# Patient Record
Sex: Female | Born: 1980 | Race: White | Hispanic: No | Marital: Married
Health system: Midwestern US, Community
[De-identification: ages and names within clinical notes are randomized; demographics above are authoritative.]

## PROBLEM LIST (undated history)

## (undated) DIAGNOSIS — E782 Mixed hyperlipidemia: Principal | ICD-10-CM

## (undated) DIAGNOSIS — D508 Other iron deficiency anemias: Secondary | ICD-10-CM

## (undated) DIAGNOSIS — K8020 Calculus of gallbladder without cholecystitis without obstruction: Secondary | ICD-10-CM

## (undated) DIAGNOSIS — K8010 Calculus of gallbladder with chronic cholecystitis without obstruction: Secondary | ICD-10-CM

## (undated) DIAGNOSIS — I10 Essential (primary) hypertension: Secondary | ICD-10-CM

---

## 2004-01-28 ENCOUNTER — Observation Stay: Payer: Self-pay

## 2004-01-31 ENCOUNTER — Ambulatory Visit: Payer: Self-pay

## 2004-03-03 ENCOUNTER — Observation Stay: Payer: Self-pay

## 2004-03-11 ENCOUNTER — Inpatient Hospital Stay: Payer: Self-pay

## 2011-04-07 ENCOUNTER — Ambulatory Visit: Payer: Self-pay

## 2011-05-06 ENCOUNTER — Observation Stay: Payer: Self-pay

## 2011-05-06 LAB — PIH PROFILE
Anion Gap: 13 (ref 7–16)
BUN: 6 mg/dL — ABNORMAL LOW (ref 7–18)
Calcium, Total: 8.5 mg/dL (ref 8.5–10.1)
Co2: 23 mmol/L (ref 21–32)
Creatinine: 0.53 mg/dL — ABNORMAL LOW (ref 0.60–1.30)
EGFR (African American): >60
Glucose: 95 mg/dL (ref 65–99)
HGB: 11.2 g/dL — ABNORMAL LOW (ref 12.0–16.0)
MCH: 30.8 pg (ref 26.0–34.0)
MCV: 89 fL (ref 80–100)
Potassium: 3.4 mmol/L — ABNORMAL LOW (ref 3.5–5.1)
RBC: 3.65 10*6/uL — ABNORMAL LOW (ref 3.80–5.20)
Sodium: 142 mmol/L (ref 136–145)
Uric Acid: 3.7 mg/dL (ref 2.6–6.0)

## 2011-05-06 LAB — PROTEIN / CREATININE RATIO, URINE
Protein, Random Urine: 41 mg/dL — ABNORMAL HIGH (ref 0–12)
Protein/Creat. Ratio: 154 mg/gCREAT (ref 0–200)

## 2011-05-08 ENCOUNTER — Inpatient Hospital Stay: Payer: Self-pay | Admitting: Obstetrics and Gynecology

## 2011-05-08 LAB — CBC WITH DIFFERENTIAL/PLATELET
Basophil %: 0.4 %
Eosinophil #: 0 10*3/uL (ref 0.0–0.7)
Eosinophil %: 0.4 %
HCT: 32.1 % — ABNORMAL LOW (ref 35.0–47.0)
HGB: 10.9 g/dL — ABNORMAL LOW (ref 12.0–16.0)
Lymphocyte #: 1.4 10*3/uL (ref 1.0–3.6)
Lymphocyte %: 11 %
MCH: 30.4 pg (ref 26.0–34.0)
MCHC: 33.9 g/dL (ref 32.0–36.0)
MCV: 90 fL (ref 80–100)
Neutrophil #: 10.4 10*3/uL — ABNORMAL HIGH (ref 1.4–6.5)
RBC: 3.59 10*6/uL — ABNORMAL LOW (ref 3.80–5.20)
RDW: 17.9 % — ABNORMAL HIGH (ref 11.5–14.5)

## 2012-07-10 IMAGING — CR DG CHEST 2V
1 series · 2 of 2 positions shown · non-contrast
Comparison: none

REASON FOR EXAM: preg pt w/ cough, bronchitis and chest pain
COMMENTS:

PROCEDURE:     DXR - DXR CHEST PA (OR AP) AND LATERAL  - April 07, 2011  [DATE]
RESULT:     Comparison: None

[Series 1: w chest pa · 0.14mm/px · 2 of 2 slices shown]
[im 1/2]
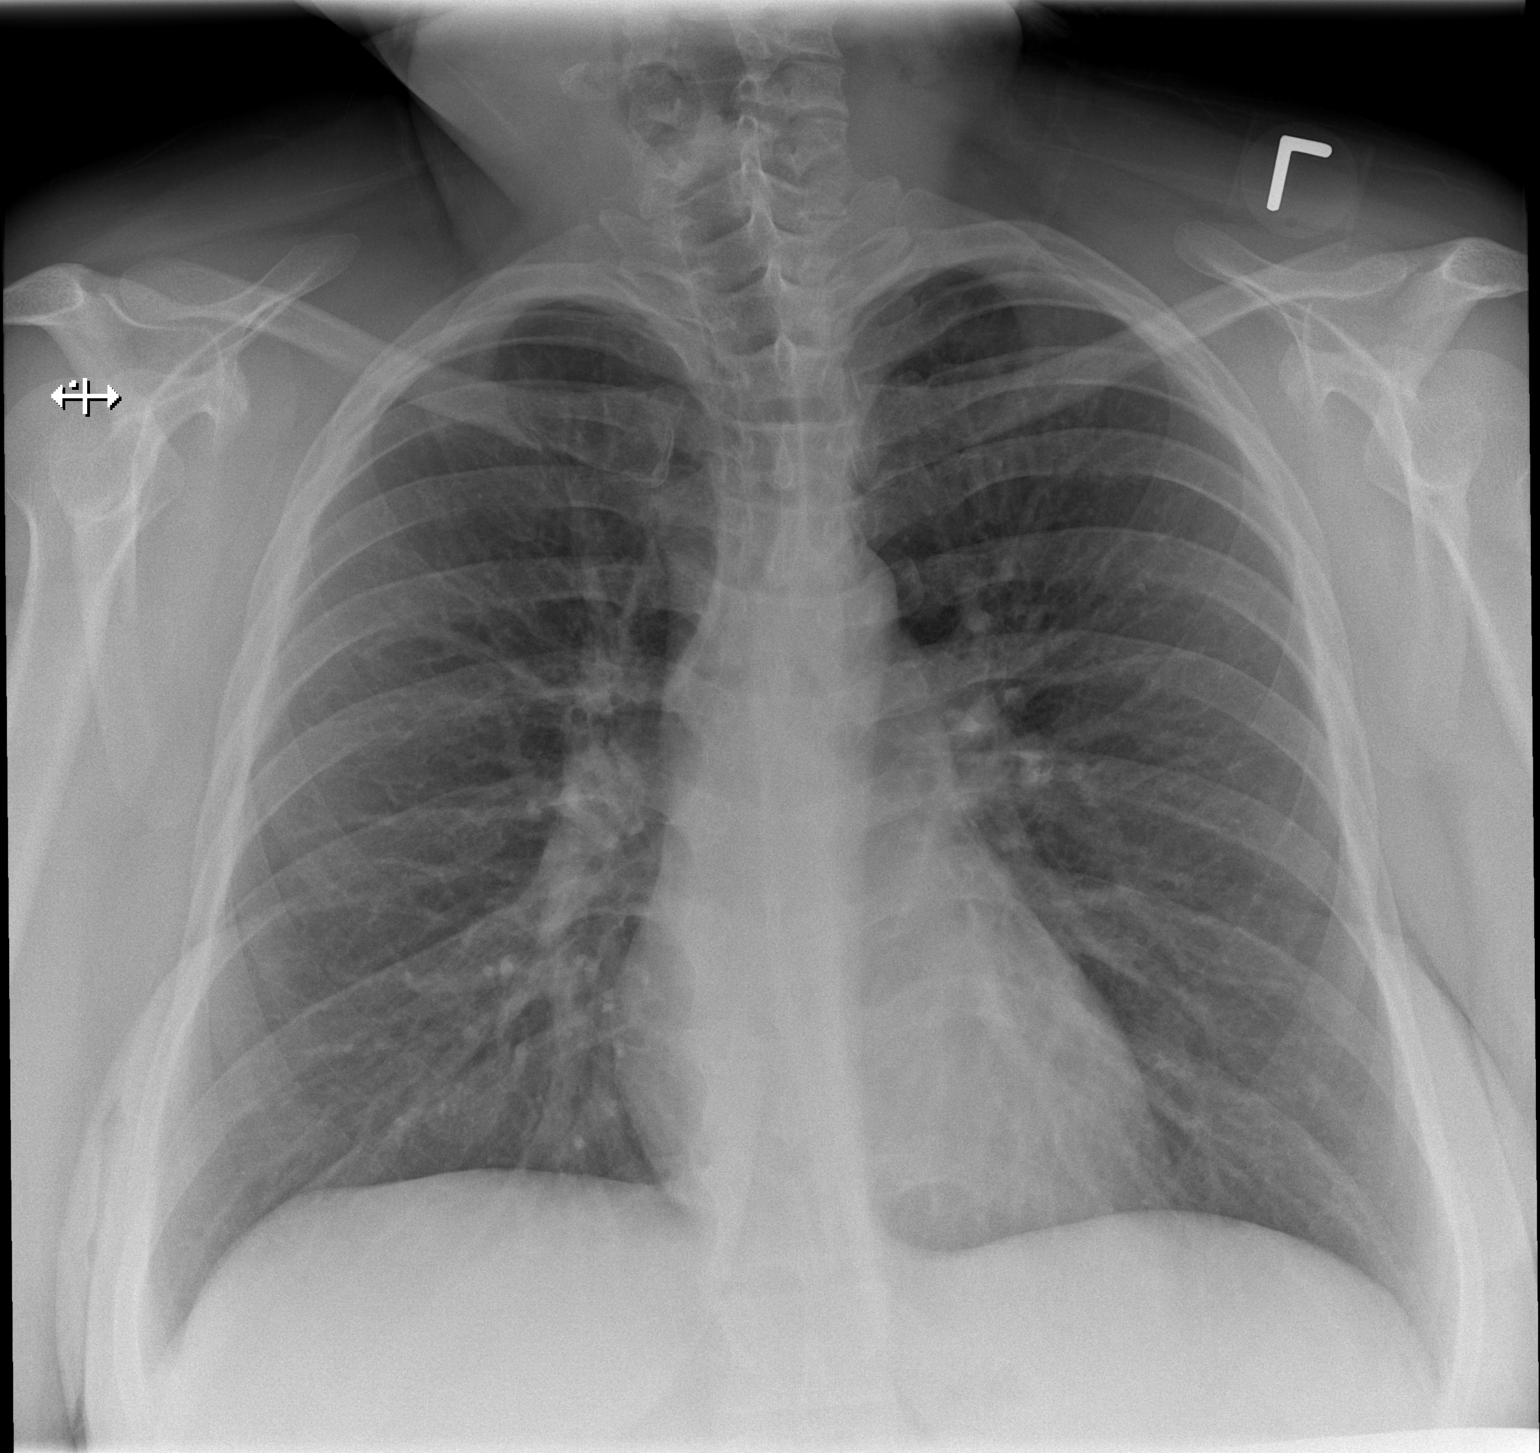
[im 2/2]
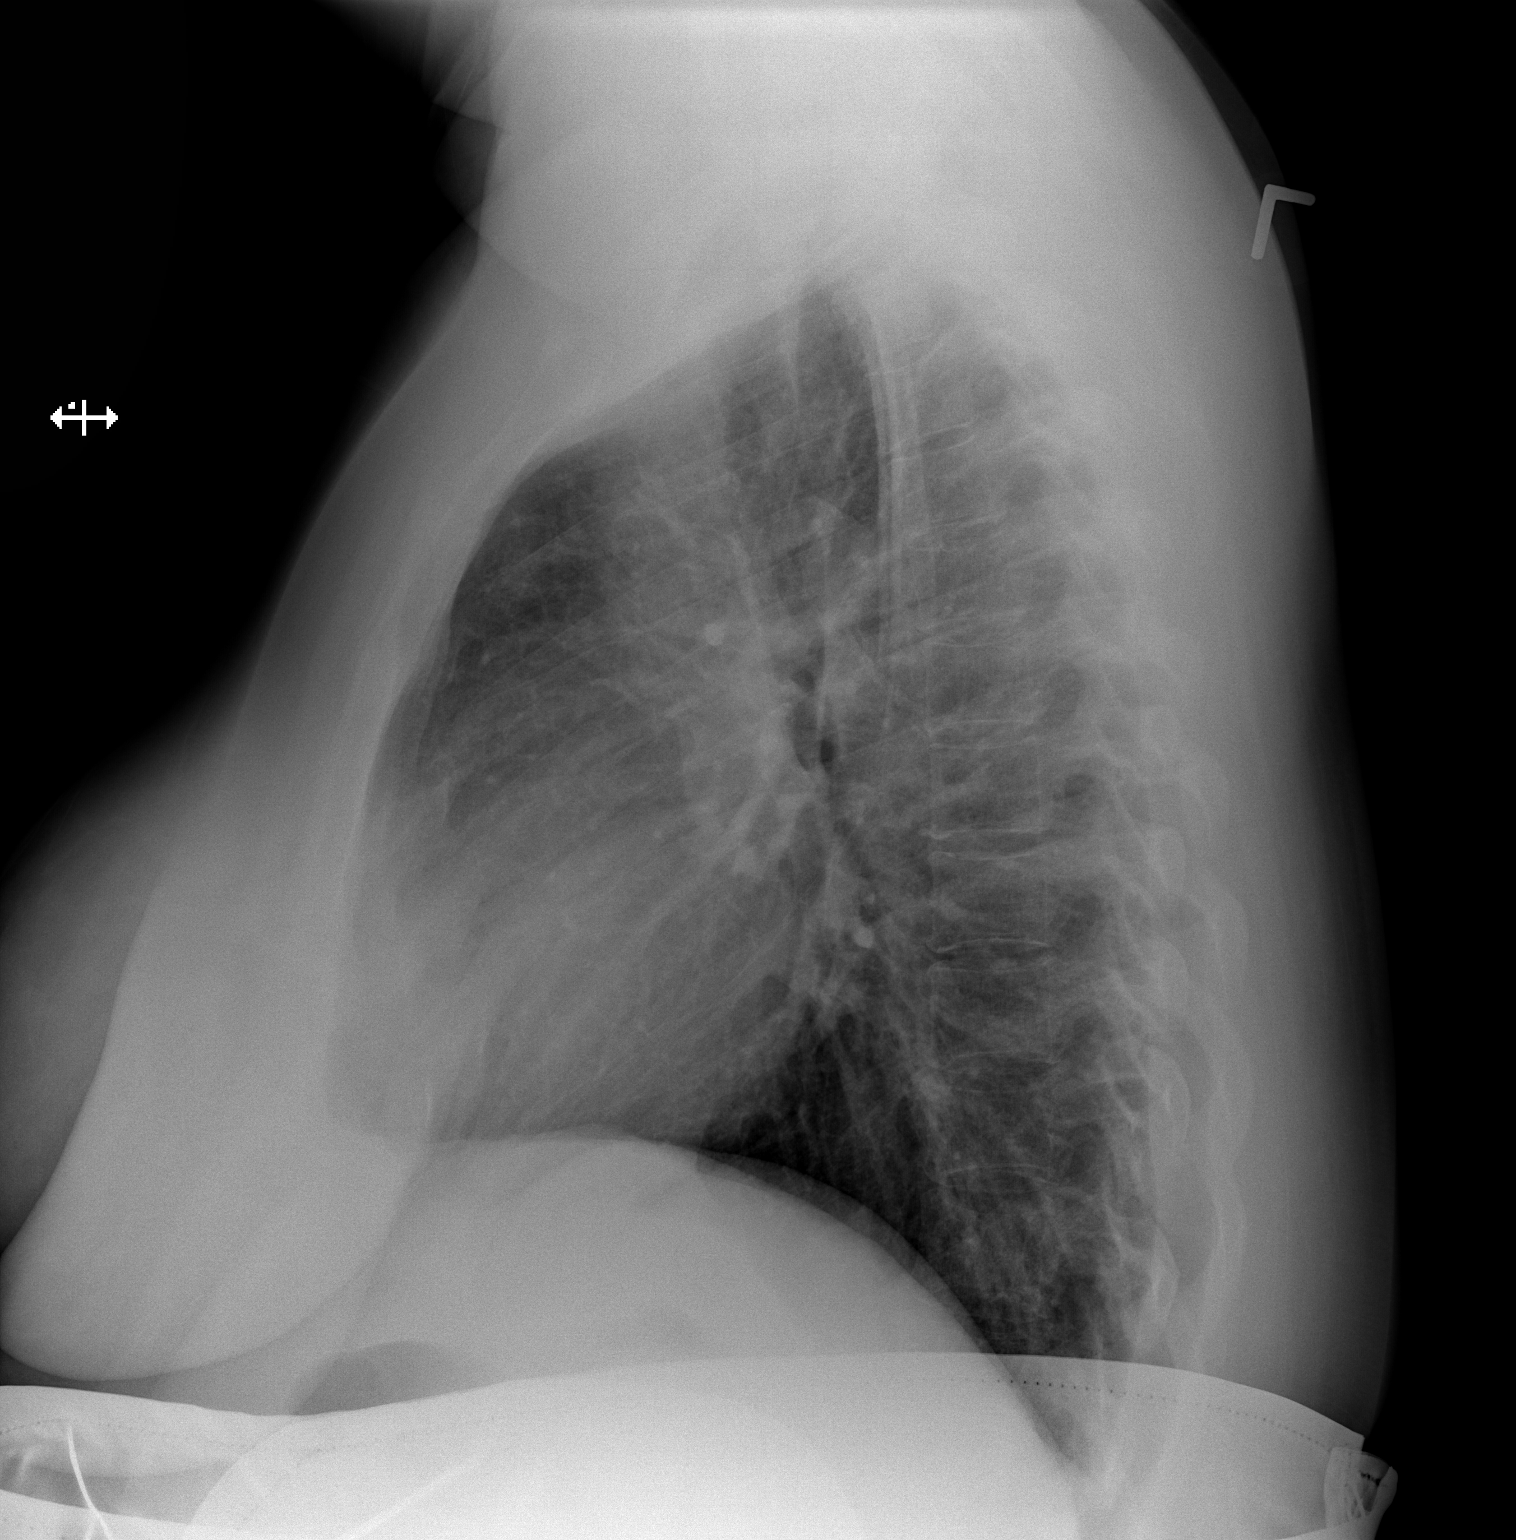

[2 of 2 positions shown; findings below may reference images not displayed]

FINDINGS: PA and lateral chest radiographs are provided.  There is no focal
parenchymal opacity, pleural effusion, or pneumothorax. The heart and
mediastinum are unremarkable.  The osseous structures are unremarkable.
IMPRESSION: No acute disease of the chest.

## 2014-08-01 NOTE — H&P (Signed)
L&D Evaluation:  History Expanded:   HPI 34 yo G3P2002 whose EDC- 05/21/11.  [facility].  Pt was hypertensive prior to pregnancy, was on lisinopril, this was d/ced and she has been taking alsomet 500 tid.    Patient's Medical History Hypertension    Patient's Surgical History LSO for ovarian cyst    Medications Pre [place]atal Vitamins  Iron  aldomet    Allergies NKDA    Social History none    Current Prenatal Course Notable For Pregnancy Induced Hypertension   Exam:   Vital Signs stable  Bp stable throuighout her stay    General no apparent distress    Mental Status clear    Chest clear    Heart normal sinus rhythm    Abdomen gravid, non-tender    Estimated Fetal Weight Average for gestational age    Edema 1+    Reflexes 2+    Mebranes Intact    FHT normal rate with no decels   Impression:   Impression evaluation for PIH   Plan:   Comments Bp have all been normal PIH profile also normal with a P/C ratio of 350 Will allow to go home, patient alsready has an appt for Thursday   Electronic Signatures: Towana Badgerosenow, Philip J (MD)  (Signed 12-Feb-13 19:26)  Authored: L&D Evaluation   Last Updated: 12-Feb-13 19:26 by Towana Badgerosenow, Philip J (MD)

## 2014-08-01 NOTE — H&P (Signed)
L&D Evaluation:  History Expanded:   HPI 34 yo G3P2002 with EDD of 05/21/11 per LMP and 14 week US. [facility] [place] and [facility] 27 [facility]. History notable for HTN currently tx with Aldomet QID with 500mg  (breakfast) 250 mg (noon) 500 mg (dinner) and 250 mg (HS). Mild fetal hydronephrosis also noted at anatomy scan. Pt presents today at 38w 1 d with c/o SROM at 0300. Occasional contractions, no VB, + FM.    Blood Type A positive    Group B Strep Results (Result >5wks must be treated as unknown) negative    Maternal HIV Negative    Maternal Syphilis Ab Nonreactive    Maternal Varicella Unknown    Rubella Results immune    Maternal T-Dap Unknown    Patient's Medical History Hypertension    Patient's Surgical History Left Salpingo-oophorectomy    Medications Pre Natal Vitamins  Metyldopa    Allergies NKDA    Social History none    Family History Non-Contributory   ROS:   ROS see HPI   Exam:   Vital Signs 130s/80s    General no apparent distress    Mental Status clear    Chest clear    Heart no murmur/gallop/rubs    Abdomen gravid, non-tender    Estimated Fetal Weight Average for gestational age    Fetal Position vertex    Edema no edema    Reflexes 1+    Pelvic 1/80/-3    Mebranes Ruptured    Description clear    FHT normal rate with no decels    Ucx rare   Impression:   Impression SROM   Plan:   Comments Minimal cervical change or contractions since initial exam 3 hours ago, discussed recommendation to begin Pitocin augmentation. Pt in agreement to plan, risks of fetal distress/hyperstimulation discussed with pt.   Electronic Signatures: Vella KohlerBrothers, Sanai Frick K (CNM)  (Signed 14-Feb-13 07:38)  Authored: L&D Evaluation   Last Updated: 14-Feb-13 07:38 by Vella KohlerBrothers, Claretta Kendra K (CNM)

## 2018-05-19 ENCOUNTER — Encounter
Admit: 2018-05-19 | Discharge: 2018-05-19 | Payer: PRIVATE HEALTH INSURANCE | Attending: Family Medicine | Primary: Family Medicine

## 2018-08-18 ENCOUNTER — Encounter: Attending: Family Medicine | Primary: Family Medicine

## 2018-10-01 ENCOUNTER — Encounter: Attending: Family Medicine | Primary: Family Medicine

## 2018-10-11 ENCOUNTER — Encounter: Admit: 2018-10-11 | Discharge: 2018-10-11 | Attending: Family Medicine | Primary: Family Medicine

## 2018-10-28 ENCOUNTER — Encounter

## 2018-10-28 MED ORDER — LISINOPRIL-HYDROCHLOROTHIAZIDE 20 MG-12.5 MG TAB
ORAL_TABLET | ORAL | 0 refills | Status: DC
Start: 2018-10-28 — End: 2018-11-12

## 2018-11-03 MED ORDER — ATORVASTATIN 20 MG TAB
20 mg | ORAL_TABLET | ORAL | 0 refills | Status: DC
Start: 2018-11-03 — End: 2018-11-29

## 2018-11-03 MED ORDER — BUSPIRONE 5 MG TAB
5 mg | ORAL_TABLET | ORAL | 0 refills | Status: DC
Start: 2018-11-03 — End: 2018-11-12

## 2018-11-12 ENCOUNTER — Ambulatory Visit: Admit: 2018-11-12 | Payer: PRIVATE HEALTH INSURANCE | Attending: Family Medicine | Primary: Family Medicine

## 2018-11-12 ENCOUNTER — Ambulatory Visit: Attending: Family Medicine | Primary: Family Medicine

## 2018-11-12 DIAGNOSIS — E039 Hypothyroidism, unspecified: Secondary | ICD-10-CM

## 2018-11-12 MED ORDER — BUSPIRONE 5 MG TAB
5 mg | ORAL_TABLET | Freq: Two times a day (BID) | ORAL | 1 refills | Status: DC
Start: 2018-11-12 — End: 2018-12-02

## 2018-11-12 MED ORDER — LISINOPRIL-HYDROCHLOROTHIAZIDE 20 MG-12.5 MG TAB
ORAL_TABLET | Freq: Every day | ORAL | 2 refills | Status: AC
Start: 2018-11-12 — End: 2018-12-12

## 2018-11-12 NOTE — Progress Notes (Signed)
Will discuss at next visit

## 2018-11-12 NOTE — Progress Notes (Signed)
Teresa Beasley is a 38 y.o. female and presents with Follow-up (anxiety)  .  HPI     Subjective:  Cardiovascular Review:  The patient has hypertension   Diet and Lifestyle: generally follows a low fat low cholesterol diet, generally follows a low sodium diet, exercises sporadically  Home BP Monitoring: is not measured at home.  Pertinent ROS: taking medications as instructed, no medication side effects noted, no TIA's, no chest pain on exertion, no dyspnea on exertion, no swelling of ankles.     Review of Systems  Review of Systems   Constitutional: Negative.  Negative for chills and fever.   HENT: Negative.  Negative for congestion, ear discharge, hearing loss, nosebleeds and tinnitus.    Eyes: Negative.  Negative for blurred vision, double vision, photophobia and pain.   Respiratory: Negative.  Negative for cough, hemoptysis and sputum production.    Cardiovascular: Negative.  Negative for chest pain and palpitations.   Gastrointestinal: Negative.  Negative for heartburn, nausea and vomiting.   Genitourinary: Negative.  Negative for dysuria, frequency and urgency.   Musculoskeletal: Negative.  Negative for back pain and myalgias.   Skin: Negative.    Neurological: Negative.  Negative for dizziness, tingling, weakness and headaches.   Endo/Heme/Allergies: Negative.    Psychiatric/Behavioral: Negative for depression and suicidal ideas. The patient is nervous/anxious. The patient does not have insomnia.          Past Medical History:   Diagnosis Date   ??? Acquired hypothyroidism 11/02/2018   ??? Bilateral ovarian cysts     Removal 03/24/2010   ??? Ear problems    ??? Essential hypertension 11/02/2018   ??? Gastro-esophageal reflux disease with esophagitis 11/02/2018   ??? Generalized anxiety disorder 11/02/2018   ??? GERD (gastroesophageal reflux disease)    ??? Hypernatremia 11/02/2018   ??? Obesity 11/02/2018   ??? Obesity (BMI 30-39.9) 11/02/2018   ??? Otalgia of right ear 11/02/2018   ??? URI (upper respiratory infection) 11/02/2018     No  past surgical history on file.  Social History     Socioeconomic History   ??? Marital status: MARRIED     Spouse name: Not on file   ??? Number of children: Not on file   ??? Years of education: Not on file   ??? Highest education level: Not on file   Tobacco Use   ??? Smoking status: Never Smoker   ??? Smokeless tobacco: Never Used     Family History   Problem Relation Age of Onset   ??? Hypertension Mother    ??? MS Father      Current Outpatient Medications   Medication Sig Dispense Refill   ??? lisinopril-hydroCHLOROthiazide (PRINZIDE, ZESTORETIC) 20-12.5 mg per tablet Take 1 Tab by mouth daily for 30 days. 30 Tab 2   ??? busPIRone (BUSPAR) 5 mg tablet Take 1 Tab by mouth two (2) times a day. 60 Tab 1   ??? atorvastatin (LIPITOR) 20 mg tablet TAKE 1 TABLET BY MOUTH EVERY DAY 30 Tab 0   ??? levothyroxine (SYNTHROID) 25 mcg tablet Take  by mouth Daily (before breakfast).     ??? ethinyl estradiol-etonogestrel (NuvaRing) 0.12-0.015 mg/24 hr vaginal ring by Intravaginal route.     ??? omeprazole (PRILOSEC) 40 mg capsule Take 40 mg by mouth daily.       No Known Allergies    Objective:  Visit Vitals  BP (!) 130/94 (BP 1 Location: Left arm, BP Patient Position: Sitting)   Pulse 89   Temp  98.3 ??F (36.8 ??C) (Temporal)   Resp 20   Ht 5\' 7"  (1.702 m)   Wt 213 lb 3.2 oz (96.7 kg)   LMP 10/22/2018   SpO2 98% Comment: room air   BMI 33.39 kg/m??       Physical Exam:   Physical Exam  Vitals signs and nursing note reviewed.   Constitutional:       Appearance: Normal appearance. She is obese.   HENT:      Head: Normocephalic and atraumatic.      Right Ear: Tympanic membrane, ear canal and external ear normal.      Left Ear: Tympanic membrane, ear canal and external ear normal.      Nose: Nose normal.      Mouth/Throat:      Mouth: Mucous membranes are moist.      Pharynx: Oropharynx is clear. No oropharyngeal exudate or posterior oropharyngeal erythema.   Eyes:      General: No scleral icterus.        Right eye: No discharge.         Left eye: No  discharge.      Extraocular Movements: Extraocular movements intact.      Conjunctiva/sclera: Conjunctivae normal.      Pupils: Pupils are equal, round, and reactive to light.   Neck:      Musculoskeletal: Normal range of motion and neck supple. No neck rigidity or muscular tenderness.      Vascular: No carotid bruit.   Cardiovascular:      Rate and Rhythm: Normal rate.      Pulses: Normal pulses.      Heart sounds: Normal heart sounds. No murmur. No gallop.    Pulmonary:      Effort: Pulmonary effort is normal. No respiratory distress.      Breath sounds: Normal breath sounds. No stridor. No wheezing, rhonchi or rales.   Chest:      Chest wall: No tenderness.   Abdominal:      General: Bowel sounds are normal. There is no distension.      Palpations: Abdomen is soft. There is no mass.      Tenderness: There is no abdominal tenderness. There is no right CVA tenderness, left CVA tenderness or rebound.      Hernia: No hernia is present.   Musculoskeletal: Normal range of motion.         General: No swelling, tenderness, deformity or signs of injury.      Right lower leg: No edema.      Left lower leg: No edema.   Lymphadenopathy:      Cervical: No cervical adenopathy.   Skin:     General: Skin is warm.      Capillary Refill: Capillary refill takes 2 to 3 seconds.      Coloration: Skin is not jaundiced or pale.      Findings: No bruising, erythema, lesion or rash.   Neurological:      General: No focal deficit present.      Mental Status: She is alert and oriented to person, place, and time.      Cranial Nerves: No cranial nerve deficit.      Sensory: No sensory deficit.      Motor: No weakness.      Coordination: Coordination normal.      Gait: Gait normal.      Deep Tendon Reflexes: Reflexes normal.   Psychiatric:         Mood  and Affect: Mood normal.         Behavior: Behavior normal.         Thought Content: Thought content normal.         Judgment: Judgment normal.             No results found for this or any  previous visit.    Assessment/Plan:    ICD-10-CM ICD-9-CM    1. Acquired hypothyroidism  E03.9 244.9 LIPID PANEL      TSH 3RD GENERATION      T4 (THYROXINE)   2. Obesity (BMI 30-39.9)  E66.9 278.00 LIPID PANEL      METABOLIC PANEL, BASIC   3. Generalized anxiety disorder  F41.1 300.02    4. Hypernatremia  E87.0 276.0    5. Essential hypertension  V37 106.2 METABOLIC PANEL, BASIC   6. Anxiety  F41.9 300.00    7. Essential (primary) hypertension  I10 401.9 lisinopril-hydroCHLOROthiazide (PRINZIDE, ZESTORETIC) 20-12.5 mg per tablet     Orders Placed This Encounter   ??? LIPID PANEL   ??? METABOLIC PANEL, BASIC   ??? TSH 3RD GENERATION   ??? T4 (THYROXINE)   ??? lisinopril-hydroCHLOROthiazide (PRINZIDE, ZESTORETIC) 20-12.5 mg per tablet     Sig: Take 1 Tab by mouth daily for 30 days.     Dispense:  30 Tab     Refill:  2   ??? busPIRone (BUSPAR) 5 mg tablet     Sig: Take 1 Tab by mouth two (2) times a day.     Dispense:  60 Tab     Refill:  1     Cannot display discharge medications since this is not an admission.

## 2018-11-12 NOTE — Progress Notes (Signed)
Teresa Beasley is a 38 y.o. female and presents with Follow-up (anxiety)  .  HPI     Subjective:  Cardiovascular Review:  The patient has hypertension   Diet and Lifestyle: generally follows a low fat low cholesterol diet, generally follows a low sodium diet, exercises sporadically  Home BP Monitoring: is not measured at home.  Pertinent ROS: taking medications as instructed, no medication side effects noted, no TIA's, no chest pain on exertion, no dyspnea on exertion, no swelling of ankles.     Review of Systems  Review of Systems   Constitutional: Negative.  Negative for chills and fever.   HENT: Negative.  Negative for congestion, ear discharge, hearing loss, nosebleeds and tinnitus.    Eyes: Negative.  Negative for blurred vision, double vision, photophobia and pain.   Respiratory: Negative.  Negative for cough, hemoptysis and sputum production.    Cardiovascular: Negative.  Negative for chest pain and palpitations.   Gastrointestinal: Negative.  Negative for heartburn, nausea and vomiting.   Genitourinary: Negative.  Negative for dysuria, frequency and urgency.   Musculoskeletal: Negative.  Negative for back pain and myalgias.   Skin: Negative.    Neurological: Negative.  Negative for dizziness, tingling, weakness and headaches.   Endo/Heme/Allergies: Negative.    Psychiatric/Behavioral: Negative for depression and suicidal ideas. The patient is nervous/anxious. The patient does not have insomnia.          Past Medical History:   Diagnosis Date   ??? Acquired hypothyroidism 11/02/2018   ??? Bilateral ovarian cysts     Removal 03/24/2010   ??? Ear problems    ??? Essential hypertension 11/02/2018   ??? Gastro-esophageal reflux disease with esophagitis 11/02/2018   ??? Generalized anxiety disorder 11/02/2018   ??? GERD (gastroesophageal reflux disease)    ??? Hypernatremia 11/02/2018   ??? Obesity 11/02/2018   ??? Obesity (BMI 30-39.9) 11/02/2018   ??? Otalgia of right ear 11/02/2018   ??? URI (upper respiratory infection) 11/02/2018      No past surgical history on file.  Social History     Socioeconomic History   ??? Marital status: MARRIED     Spouse name: Not on file   ??? Number of children: Not on file   ??? Years of education: Not on file   ??? Highest education level: Not on file   Tobacco Use   ??? Smoking status: Never Smoker   ??? Smokeless tobacco: Never Used     Family History   Problem Relation Age of Onset   ??? Hypertension Mother    ??? MS Father      Current Outpatient Medications   Medication Sig Dispense Refill   ??? lisinopril-hydroCHLOROthiazide (PRINZIDE, ZESTORETIC) 20-12.5 mg per tablet Take 1 Tab by mouth daily for 30 days. 30 Tab 2   ??? busPIRone (BUSPAR) 5 mg tablet Take 1 Tab by mouth two (2) times a day. 60 Tab 1   ??? atorvastatin (LIPITOR) 20 mg tablet TAKE 1 TABLET BY MOUTH EVERY DAY 30 Tab 0   ??? levothyroxine (SYNTHROID) 25 mcg tablet Take  by mouth Daily (before breakfast).     ??? ethinyl estradiol-etonogestrel (NuvaRing) 0.12-0.015 mg/24 hr vaginal ring by Intravaginal route.     ??? omeprazole (PRILOSEC) 40 mg capsule Take 40 mg by mouth daily.       No Known Allergies    Objective:  Visit Vitals  BP (!) 130/94 (BP 1 Location: Left arm, BP Patient Position: Sitting)   Pulse 89   Temp  98.3 ??F (36.8 ??C) (Temporal)   Resp 20   Ht 5\' 7"  (1.702 m)   Wt 213 lb 3.2 oz (96.7 kg)   LMP 10/22/2018   SpO2 98% Comment: room air   BMI 33.39 kg/m??       Physical Exam:   Physical Exam  Vitals signs and nursing note reviewed.   Constitutional:       Appearance: Normal appearance. She is obese.   HENT:      Head: Normocephalic and atraumatic.      Right Ear: Tympanic membrane, ear canal and external ear normal.      Left Ear: Tympanic membrane, ear canal and external ear normal.      Nose: Nose normal.      Mouth/Throat:      Mouth: Mucous membranes are moist.      Pharynx: Oropharynx is clear. No oropharyngeal exudate or posterior oropharyngeal erythema.   Eyes:      General: No scleral icterus.        Right eye: No discharge.          Left eye: No discharge.      Extraocular Movements: Extraocular movements intact.      Conjunctiva/sclera: Conjunctivae normal.      Pupils: Pupils are equal, round, and reactive to light.   Neck:      Musculoskeletal: Normal range of motion and neck supple. No neck rigidity or muscular tenderness.      Vascular: No carotid bruit.   Cardiovascular:      Rate and Rhythm: Normal rate.      Pulses: Normal pulses.      Heart sounds: Normal heart sounds. No murmur. No gallop.    Pulmonary:      Effort: Pulmonary effort is normal. No respiratory distress.      Breath sounds: Normal breath sounds. No stridor. No wheezing, rhonchi or rales.   Chest:      Chest wall: No tenderness.   Abdominal:      General: Bowel sounds are normal. There is no distension.      Palpations: Abdomen is soft. There is no mass.      Tenderness: There is no abdominal tenderness. There is no right CVA tenderness, left CVA tenderness or rebound.      Hernia: No hernia is present.   Musculoskeletal: Normal range of motion.         General: No swelling, tenderness, deformity or signs of injury.      Right lower leg: No edema.      Left lower leg: No edema.   Lymphadenopathy:      Cervical: No cervical adenopathy.   Skin:     General: Skin is warm.      Capillary Refill: Capillary refill takes 2 to 3 seconds.      Coloration: Skin is not jaundiced or pale.      Findings: No bruising, erythema, lesion or rash.   Neurological:      General: No focal deficit present.      Mental Status: She is alert and oriented to person, place, and time.      Cranial Nerves: No cranial nerve deficit.      Sensory: No sensory deficit.      Motor: No weakness.      Coordination: Coordination normal.      Gait: Gait normal.      Deep Tendon Reflexes: Reflexes normal.   Psychiatric:         Mood  and Affect: Mood normal.         Behavior: Behavior normal.         Thought Content: Thought content normal.         Judgment: Judgment normal.              No results found for this or any previous visit.    Assessment/Plan:    ICD-10-CM ICD-9-CM    1. Acquired hypothyroidism  E03.9 244.9 LIPID PANEL      TSH 3RD GENERATION      T4 (THYROXINE)   2. Obesity (BMI 30-39.9)  E66.9 278.00 LIPID PANEL      METABOLIC PANEL, BASIC   3. Generalized anxiety disorder  F41.1 300.02    4. Hypernatremia  E87.0 276.0    5. Essential hypertension  X91 478.2 METABOLIC PANEL, BASIC   6. Anxiety  F41.9 300.00    7. Essential (primary) hypertension  I10 401.9 lisinopril-hydroCHLOROthiazide (PRINZIDE, ZESTORETIC) 20-12.5 mg per tablet     Orders Placed This Encounter   ??? LIPID PANEL   ??? METABOLIC PANEL, BASIC   ??? TSH 3RD GENERATION   ??? T4 (THYROXINE)   ??? lisinopril-hydroCHLOROthiazide (PRINZIDE, ZESTORETIC) 20-12.5 mg per tablet     Sig: Take 1 Tab by mouth daily for 30 days.     Dispense:  30 Tab     Refill:  2   ??? busPIRone (BUSPAR) 5 mg tablet     Sig: Take 1 Tab by mouth two (2) times a day.     Dispense:  60 Tab     Refill:  1     Cannot display discharge medications since this is not an admission.

## 2018-11-29 MED ORDER — ATORVASTATIN 20 MG TAB
20 mg | ORAL_TABLET | ORAL | 1 refills | Status: DC
Start: 2018-11-29 — End: 2018-12-27

## 2018-12-02 MED ORDER — BUSPIRONE 5 MG TAB
5 mg | ORAL_TABLET | Freq: Two times a day (BID) | ORAL | 1 refills | Status: DC
Start: 2018-12-02 — End: 2018-12-08

## 2018-12-08 MED ORDER — BUSPIRONE 5 MG TAB
5 mg | ORAL_TABLET | Freq: Two times a day (BID) | ORAL | 1 refills | Status: DC
Start: 2018-12-08 — End: 2019-01-24

## 2018-12-27 MED ORDER — ATORVASTATIN 20 MG TAB
20 mg | ORAL_TABLET | ORAL | 1 refills | Status: DC
Start: 2018-12-27 — End: 2019-01-24

## 2019-01-24 MED ORDER — ATORVASTATIN 20 MG TAB
20 mg | ORAL_TABLET | ORAL | 1 refills | Status: DC
Start: 2019-01-24 — End: 2019-02-23

## 2019-01-24 MED ORDER — BUSPIRONE 5 MG TAB
5 mg | ORAL_TABLET | Freq: Two times a day (BID) | ORAL | 0 refills | Status: AC
Start: 2019-01-24 — End: 2019-02-23

## 2019-02-11 ENCOUNTER — Encounter: Attending: Family Medicine | Primary: Family Medicine

## 2019-02-13 LAB — LIPID PANEL
Cholesterol, Total: 119 mg/dL (ref 100–199)
Cholesterol, total: 119 mg/dL (ref 100–199)
HDL Cholesterol: 42 mg/dL (ref >39)
HDL: 42 mg/dL (ref >39)
LDL Calculated: 65 mg/dL (ref 0–99)
LDL, calculated: 65 mg/dL (ref 0–99)
Triglyceride: 49 mg/dL (ref 0–149)
Triglycerides: 49 mg/dL (ref 0–149)
VLDL, calculated: 12 mg/dL (ref 5–40)
VLDL: 12 mg/dL (ref 5–40)

## 2019-02-13 LAB — BASIC METABOLIC PANEL
BUN: 8 mg/dL (ref 6–20)
Bun/Cre Ratio: 11 NA (ref 9–23)
CO2: 23 mmol/L (ref 20–29)
Calcium: 9.3 mg/dL (ref 8.7–10.2)
Chloride: 102 mmol/L (ref 96–106)
Creatinine: 0.7 mg/dL (ref 0.57–1.00)
EGFR IF NonAfrican American: 110 mL/min/{1.73_m2} (ref >59)
GFR African American: 127 mL/min/{1.73_m2} (ref >59)
Glucose: 95 mg/dL (ref 65–99)
Potassium: 4.1 mmol/L (ref 3.5–5.2)
Sodium: 140 mmol/L (ref 134–144)

## 2019-02-13 LAB — T4: T4, Total: 7.8 ug/dL (ref 4.5–12.0)

## 2019-02-13 LAB — TSH 3RD GENERATION
TSH: 2.2 u[IU]/mL (ref 0.450–4.500)
TSH: 2.2 u[IU]/mL (ref 0.450–4.500)

## 2019-02-13 LAB — METABOLIC PANEL, BASIC
BUN/Creatinine ratio: 11 (ref 9–23)
BUN: 8 mg/dL (ref 6–20)
CO2: 23 mmol/L (ref 20–29)
Calcium: 9.3 mg/dL (ref 8.7–10.2)
Chloride: 102 mmol/L (ref 96–106)
Creatinine: 0.7 mg/dL (ref 0.57–1.00)
GFR est AA: 127 mL/min/{1.73_m2} (ref >59)
GFR est non-AA: 110 mL/min/{1.73_m2} (ref >59)
Glucose: 95 mg/dL (ref 65–99)
Potassium: 4.1 mmol/L (ref 3.5–5.2)
Sodium: 140 mmol/L (ref 134–144)

## 2019-02-13 LAB — T4 (THYROXINE): T4, Total: 7.8 ug/dL (ref 4.5–12.0)

## 2019-02-23 ENCOUNTER — Telehealth: Admit: 2019-02-23 | Payer: PRIVATE HEALTH INSURANCE | Attending: Family Medicine | Primary: Family Medicine

## 2019-02-23 ENCOUNTER — Telehealth: Attending: Family Medicine | Primary: Family Medicine

## 2019-02-23 DIAGNOSIS — E039 Hypothyroidism, unspecified: Secondary | ICD-10-CM

## 2019-02-23 MED ORDER — LEVOTHYROXINE 25 MCG TAB
25 mcg | ORAL_TABLET | Freq: Every day | ORAL | 0 refills | Status: DC
Start: 2019-02-23 — End: 2019-08-20

## 2019-02-23 MED ORDER — ATORVASTATIN 20 MG TAB
20 mg | ORAL_TABLET | Freq: Every day | ORAL | 0 refills | Status: DC
Start: 2019-02-23 — End: 2019-05-18

## 2019-02-23 MED ORDER — LISINOPRIL-HYDROCHLOROTHIAZIDE 20 MG-12.5 MG TAB
ORAL_TABLET | Freq: Every day | ORAL | 0 refills | Status: DC
Start: 2019-02-23 — End: 2019-05-18

## 2019-02-23 NOTE — Progress Notes (Signed)
HPI    Teresa Beasley is a 38 y.o. female and presents today for Follow-up  .  HPI   Allergies    No Known Allergies     Medications    Current Outpatient Medications   Medication Sig Dispense   ??? atorvastatin (LIPITOR) 20 mg tablet TAKE 1 TABLET BY MOUTH EVERY DAY 30 Tab   ??? busPIRone (BUSPAR) 5 mg tablet Take 1 Tab by mouth two (2) times a day for 30 days. 60 Tab   ??? levothyroxine (SYNTHROID) 25 mcg tablet Take  by mouth Daily (before breakfast).    ??? ethinyl estradiol-etonogestrel (NuvaRing) 0.12-0.015 mg/24 hr vaginal ring by Intravaginal route.    ??? omeprazole (PRILOSEC) 40 mg capsule Take 40 mg by mouth daily.      No current facility-administered medications for this visit.         Health Maintenance    Health Maintenance Due   Topic Date Due   ??? DTaP/Tdap/Td series (1 - Tdap) 12/24/2001   ??? PAP AKA CERVICAL CYTOLOGY  12/24/2001   ??? Flu Vaccine (1) 11/23/2018        Problem List    Patient Active Problem List    Diagnosis Date Noted   ??? Anxiety 11/12/2018   ??? Essential (primary) hypertension 11/12/2018   ??? Obesity (BMI 30-39.9) 11/02/2018   ??? Essential hypertension 11/02/2018   ??? Gastro-esophageal reflux disease with esophagitis 11/02/2018   ??? Generalized anxiety disorder 11/02/2018   ??? Acquired hypothyroidism 11/02/2018   ??? Obesity 11/02/2018   ??? Otalgia of right ear 11/02/2018   ??? URI (upper respiratory infection) 11/02/2018   ??? Hypernatremia 11/02/2018   ??? Ear problems    ??? GERD (gastroesophageal reflux disease)         Family Hx    Family History   Problem Relation Age of Onset   ??? Hypertension Mother    ??? MS Father         Social Hx    Social History     Socioeconomic History   ??? Marital status: MARRIED     Spouse name: Not on file   ??? Number of children: Not on file   ??? Years of education: Not on file   ??? Highest education level: Not on file   Tobacco Use   ??? Smoking status: Never Smoker   ??? Smokeless tobacco: Never Used        Surgical Hx    History reviewed. No pertinent surgical history.        Vitals    There were no vitals taken for this visit.     ROS    Review of Systems   Constitutional: Negative.  Negative for chills and fever.   HENT: Negative.  Negative for congestion, ear discharge, hearing loss, nosebleeds and tinnitus.    Eyes: Negative.  Negative for blurred vision, double vision, photophobia and pain.   Respiratory: Negative.  Negative for cough, hemoptysis and sputum production.    Cardiovascular: Negative.  Negative for chest pain and palpitations.   Gastrointestinal: Negative.  Negative for heartburn, nausea and vomiting.   Genitourinary: Negative.  Negative for dysuria, frequency and urgency.   Musculoskeletal: Negative.  Negative for back pain and myalgias.   Skin: Negative.    Neurological: Negative.  Negative for dizziness, tingling, weakness and headaches.   Endo/Heme/Allergies: Negative.    Psychiatric/Behavioral: Negative.  Negative for depression and suicidal ideas. The patient does not have insomnia.    All other systems  reviewed and are negative.        Physical Exam      Physical Exam  Vitals signs and nursing note reviewed.   Constitutional:       Appearance: Normal appearance. She is obese.   HENT:      Head: Normocephalic and atraumatic.      Right Ear: Tympanic membrane, ear canal and external ear normal.      Left Ear: Tympanic membrane, ear canal and external ear normal.      Nose: Nose normal.      Mouth/Throat:      Mouth: Mucous membranes are moist.      Pharynx: Oropharynx is clear. No oropharyngeal exudate or posterior oropharyngeal erythema.   Eyes:      General: No scleral icterus.        Right eye: No discharge.         Left eye: No discharge.      Extraocular Movements: Extraocular movements intact.      Conjunctiva/sclera: Conjunctivae normal.      Pupils: Pupils are equal, round, and reactive to light.   Neck:      Musculoskeletal: Normal range of motion and neck supple. No neck rigidity or muscular tenderness.      Vascular: No carotid bruit.    Cardiovascular:      Rate and Rhythm: Normal rate.      Pulses: Normal pulses.      Heart sounds: Normal heart sounds. No murmur. No gallop.    Pulmonary:      Effort: Pulmonary effort is normal. No respiratory distress.      Breath sounds: Normal breath sounds. No stridor. No wheezing, rhonchi or rales.   Chest:      Chest wall: No tenderness.   Abdominal:      General: Bowel sounds are normal. There is no distension.      Palpations: Abdomen is soft. There is no mass.      Tenderness: There is no abdominal tenderness. There is no right CVA tenderness, left CVA tenderness or rebound.      Hernia: No hernia is present.   Musculoskeletal: Normal range of motion.         General: No swelling, tenderness, deformity or signs of injury.      Right lower leg: No edema.      Left lower leg: No edema.   Lymphadenopathy:      Cervical: No cervical adenopathy.   Skin:     General: Skin is warm.      Capillary Refill: Capillary refill takes 2 to 3 seconds.      Coloration: Skin is not jaundiced or pale.      Findings: No bruising, erythema, lesion or rash.   Neurological:      General: No focal deficit present.      Mental Status: She is alert and oriented to person, place, and time.      Cranial Nerves: No cranial nerve deficit.      Sensory: No sensory deficit.      Motor: No weakness.      Coordination: Coordination normal.      Gait: Gait normal.      Deep Tendon Reflexes: Reflexes normal.   Psychiatric:         Mood and Affect: Mood normal.         Behavior: Behavior normal.         Thought Content: Thought content normal.  Judgment: Judgment normal.          Assessment/Plan    Diagnoses and all orders for this visit:    1. Acquired hypothyroidism    2. Essential (primary) hypertension    3. Mixed hyperlipidemia    4. Class 1 obesity due to excess calories with serious comorbidity and body mass index (BMI) of 33.0 to 33.9 in adult    Other orders  -     levothyroxine (SYNTHROID) 25 mcg tablet; Take 1 Tab by mouth  Daily (before breakfast) for 90 days.  -     atorvastatin (LIPITOR) 20 mg tablet; Take 1 Tab by mouth daily.  -     lisinopril-hydroCHLOROthiazide (PRINZIDE, ZESTORETIC) 20-12.5 mg per tablet; Take 1 Tab by mouth daily for 90 days.         Health Maintenance Items reviewed with patient as noted.

## 2019-02-23 NOTE — Progress Notes (Signed)
HPI    Rola Lennon is a 38 y.o. female and presents today for Follow-up  .  HPI   Allergies    No Known Allergies     Medications    Current Outpatient Medications   Medication Sig Dispense   ??? atorvastatin (LIPITOR) 20 mg tablet TAKE 1 TABLET BY MOUTH EVERY DAY 30 Tab   ??? busPIRone (BUSPAR) 5 mg tablet Take 1 Tab by mouth two (2) times a day for 30 days. 60 Tab   ??? levothyroxine (SYNTHROID) 25 mcg tablet Take  by mouth Daily (before breakfast).    ??? ethinyl estradiol-etonogestrel (NuvaRing) 0.12-0.015 mg/24 hr vaginal ring by Intravaginal route.    ??? omeprazole (PRILOSEC) 40 mg capsule Take 40 mg by mouth daily.      No current facility-administered medications for this visit.         Health Maintenance    Health Maintenance Due   Topic Date Due   ??? DTaP/Tdap/Td series (1 - Tdap) 12/24/2001   ??? PAP AKA CERVICAL CYTOLOGY  12/24/2001   ??? Flu Vaccine (1) 11/23/2018        Problem List    Patient Active Problem List    Diagnosis Date Noted   ??? Anxiety 11/12/2018   ??? Essential (primary) hypertension 11/12/2018   ??? Obesity (BMI 30-39.9) 11/02/2018   ??? Essential hypertension 11/02/2018   ??? Gastro-esophageal reflux disease with esophagitis 11/02/2018   ??? Generalized anxiety disorder 11/02/2018   ??? Acquired hypothyroidism 11/02/2018   ??? Obesity 11/02/2018   ??? Otalgia of right ear 11/02/2018   ??? URI (upper respiratory infection) 11/02/2018   ??? Hypernatremia 11/02/2018   ??? Ear problems    ??? GERD (gastroesophageal reflux disease)         Family Hx    Family History   Problem Relation Age of Onset   ??? Hypertension Mother    ??? MS Father         Social Hx    Social History     Socioeconomic History   ??? Marital status: MARRIED     Spouse name: Not on file   ??? Number of children: Not on file   ??? Years of education: Not on file   ??? Highest education level: Not on file   Tobacco Use   ??? Smoking status: Never Smoker   ??? Smokeless tobacco: Never Used        Surgical Hx    History reviewed. No pertinent surgical history.        Vitals    There were no vitals taken for this visit.     ROS    Review of Systems   Constitutional: Negative.  Negative for chills and fever.   HENT: Negative.  Negative for congestion, ear discharge, hearing loss, nosebleeds and tinnitus.    Eyes: Negative.  Negative for blurred vision, double vision, photophobia and pain.   Respiratory: Negative.  Negative for cough, hemoptysis and sputum production.    Cardiovascular: Negative.  Negative for chest pain and palpitations.   Gastrointestinal: Negative.  Negative for heartburn, nausea and vomiting.   Genitourinary: Negative.  Negative for dysuria, frequency and urgency.   Musculoskeletal: Negative.  Negative for back pain and myalgias.   Skin: Negative.    Neurological: Negative.  Negative for dizziness, tingling, weakness and headaches.   Endo/Heme/Allergies: Negative.    Psychiatric/Behavioral: Negative.  Negative for depression and suicidal ideas. The patient does not have insomnia.    All other systems  reviewed and are negative.        Physical Exam      Physical Exam  Vitals signs and nursing note reviewed.   Constitutional:       Appearance: Normal appearance. She is obese.   HENT:      Head: Normocephalic and atraumatic.      Right Ear: Tympanic membrane, ear canal and external ear normal.      Left Ear: Tympanic membrane, ear canal and external ear normal.      Nose: Nose normal.      Mouth/Throat:      Mouth: Mucous membranes are moist.      Pharynx: Oropharynx is clear. No oropharyngeal exudate or posterior oropharyngeal erythema.   Eyes:      General: No scleral icterus.        Right eye: No discharge.         Left eye: No discharge.      Extraocular Movements: Extraocular movements intact.      Conjunctiva/sclera: Conjunctivae normal.      Pupils: Pupils are equal, round, and reactive to light.   Neck:      Musculoskeletal: Normal range of motion and neck supple. No neck rigidity or muscular tenderness.      Vascular: No carotid bruit.   Cardiovascular:       Rate and Rhythm: Normal rate.      Pulses: Normal pulses.      Heart sounds: Normal heart sounds. No murmur. No gallop.    Pulmonary:      Effort: Pulmonary effort is normal. No respiratory distress.      Breath sounds: Normal breath sounds. No stridor. No wheezing, rhonchi or rales.   Chest:      Chest wall: No tenderness.   Abdominal:      General: Bowel sounds are normal. There is no distension.      Palpations: Abdomen is soft. There is no mass.      Tenderness: There is no abdominal tenderness. There is no right CVA tenderness, left CVA tenderness or rebound.      Hernia: No hernia is present.   Musculoskeletal: Normal range of motion.         General: No swelling, tenderness, deformity or signs of injury.      Right lower leg: No edema.      Left lower leg: No edema.   Lymphadenopathy:      Cervical: No cervical adenopathy.   Skin:     General: Skin is warm.      Capillary Refill: Capillary refill takes 2 to 3 seconds.      Coloration: Skin is not jaundiced or pale.      Findings: No bruising, erythema, lesion or rash.   Neurological:      General: No focal deficit present.      Mental Status: She is alert and oriented to person, place, and time.      Cranial Nerves: No cranial nerve deficit.      Sensory: No sensory deficit.      Motor: No weakness.      Coordination: Coordination normal.      Gait: Gait normal.      Deep Tendon Reflexes: Reflexes normal.   Psychiatric:         Mood and Affect: Mood normal.         Behavior: Behavior normal.         Thought Content: Thought content normal.  Judgment: Judgment normal.          Assessment/Plan    Diagnoses and all orders for this visit:    1. Acquired hypothyroidism    2. Essential (primary) hypertension    3. Mixed hyperlipidemia    4. Class 1 obesity due to excess calories with serious comorbidity and body mass index (BMI) of 33.0 to 33.9 in adult    Other orders  -     levothyroxine (SYNTHROID) 25 mcg tablet; Take 1 Tab by mouth Daily  (before breakfast) for 90 days.  -     atorvastatin (LIPITOR) 20 mg tablet; Take 1 Tab by mouth daily.  -     lisinopril-hydroCHLOROthiazide (PRINZIDE, ZESTORETIC) 20-12.5 mg per tablet; Take 1 Tab by mouth daily for 90 days.         Health Maintenance Items reviewed with patient as noted.

## 2019-02-23 NOTE — Patient Instructions (Signed)
Learning About Low-Fat Eating  What is low-fat eating?    Most food has some fat in it. Your body needs some fat to be healthy. But some kinds of fats are healthier than others.  In a low-fat eating plan, you try to choose healthier fats and eat fewer unhealthy fats. Healthy fats include olive and canola oil. Try to avoid eating too much saturated fat (such as in cheese and meats) and trans fat (a type of fat found in many packaged snack foods and other baked goods).  You do not need to cut all fat from your diet. But you can make healthier choices about the types and amount of fat you eat.  Even though it is a good idea to choose healthier fats, it is still important to be careful of how much fat you eat, because all fats are high in calories.  What are the different types of fats?  Unhealthy fat  ?? Saturated fat. Saturated fats are mostly in animal foods, such as meat and dairy foods. Tropical oils, such as coconut oil, palm oil, and cocoa butter, are also saturated fats.  Healthy fats  ?? Monounsaturated fat. Monounsaturated fats are liquid at room temperature but get solid when refrigerated. Eating foods that are high in this fat may help lower your "bad" (LDL) cholesterol, keep your "good" (HDL) cholesterol level up, and lower your chances of getting coronary artery disease. This fat is found in canola oil, olive oil, peanut oil, olives, avocados, nuts, and nut butters.  ?? Polyunsaturated fat. Polyunsaturated fats are liquid at room temperature. They are in safflower, sunflower, and corn oils. They are also the main fat in seafood. Omega-3 fatty acids are types of polyunsaturated fat. Eating fish may lower your chances of getting coronary artery disease. Fatty fish such as salmon and mackerel contain these healthy fatty acids. So do ground flaxseeds and flaxseed oil, soybeans, walnuts, and seeds.  Why cut down on unhealthy fats?  Eating foods that contain saturated fats can raise the LDL ("bad")  cholesterol in your blood. Having a high level of LDL cholesterol increases your chance of hardening of the arteries (atherosclerosis), which can lead to heart disease, heart attack, and stroke.  Trans fat raises the level of "bad" LDL cholesterol in your blood and may lower the "good" HDL cholesterol in your blood. Trans fat can raise your risk of heart disease, heart attack, and stroke.  In general:  ?? No more than 10% of your daily calories should come from saturated fat. This is about 20 grams in a 2,000-calorie diet.  ?? No more than 10% of your daily calories should come from polyunsaturated fat. This is about 20 grams in a 2,000-calorie diet.  ?? Monounsaturated fats can be up to 15% of your daily calories. This is about 25 to 30 grams in a 2,000-calorie diet.  If you're not sure how much fat you should be eating or how many calories you need each day to stay at a healthy weight, talk to a registered dietitian. He or she can help you create a plan that's right for you.  What can you do to cut down on fat?  Foods like cheese, butter, sausage, and desserts can have a lot of unhealthy fats. Try these tips for healthier meals at home and when you eat out.  At home  ?? Fill up on fruits, vegetables, and whole grains.  ?? Think of meat as a side dish instead of as the main part   of your meal.  ?? When you do eat meat, make it extra-lean ground beef (97% lean), ground turkey breast (without skin added), meats with fat trimmed off before cooking, or skinless chicken.  ?? Try main dishes that use whole wheat pasta, brown rice, dried beans, or vegetables.  ?? Use cooking methods that use little or no fat, such as broiling, steaming, or grilling. Use cooking spray instead of oil. If you use oil, use a monounsaturated oil, such as canola or olive oil.  ?? Read food labels on canned, bottled, or packaged foods. Choose those with little saturated fat and no trans fat.  When eating out at a restaurant   ?? Order foods that are broiled or poached instead of fried or breaded.  ?? Cut back on the amount of butter or margarine that you use on bread. Use small amounts of olive oil instead.  ?? Order sauces, gravies, and salad dressings on the side, and use only a little.  ?? When you order pasta, choose tomato sauce instead of cream sauce.  ?? Ask for salsa with your baked potato instead of sour cream, butter, cheese, or bacon.  Where can you learn more?  Go to https://www.healthwise.net/[facility]  Enter W495 in the search box to learn more about "Learning About Low-Fat Eating."  Current as of: November 12, 2017??????????????????????????????Content Version: 12.6  ?? 2006-2020 [employer].   Care instructions adapted under license by [employer] (which disclaims liability or warranty for this information). If you have questions about a medical condition or this instruction, always ask your healthcare professional. [employer] disclaims any warranty or liability for your use of this information.         Low Sodium Diet (2,000 Milligram): Care Instructions  Your Care Instructions     Too much sodium causes your body to hold on to extra water. This can raise your blood pressure and force your heart and kidneys to work harder. In very serious cases, this could cause you to be put in the hospital. It might even be life-threatening. By limiting sodium, you will feel better and lower your risk of serious problems.  The most common source of sodium is salt. People get most of the salt in their diet from canned, prepared, and packaged foods. Fast food and restaurant meals also are very high in sodium. Your doctor will probably limit your sodium to less than 2,000 milligrams (mg) a day. This limit counts all the sodium in prepared and packaged foods and any salt you add to your food.  Follow-up care is a key part of your treatment and safety. Be sure to make  and go to all appointments, and call your doctor if you are having problems. It's also a good idea to know your test results and keep a list of the medicines you take.  How can you care for yourself at home?  Read food labels  ?? Read labels on cans and food packages. The labels tell you how much sodium is in each serving. Make sure that you look at the serving size. If you eat more than the serving size, you have eaten more sodium.  ?? Food labels also tell you the Percent Daily Value for sodium. Choose products with low Percent Daily Values for sodium.  ?? Be aware that sodium can come in forms other than salt, including monosodium glutamate (MSG), sodium citrate, and sodium bicarbonate (baking soda). MSG is often added to Asian food. When you eat out, you   can sometimes ask for food without MSG or added salt.  Buy low-sodium foods  ?? Buy foods that are labeled "unsalted" (no salt added), "sodium-free" (less than 5 mg of sodium per serving), or "low-sodium" (less than 140 mg of sodium per serving). Foods labeled "reduced-sodium" and "light sodium" may still have too much sodium. Be sure to read the label to see how much sodium you are getting.  ?? Buy fresh vegetables, or frozen vegetables without added sauces. Buy low-sodium versions of canned vegetables, soups, and other canned goods.  Prepare low-sodium meals  ?? Cut back on the amount of salt you use in cooking. This will help you adjust to the taste. Do not add salt after cooking. One teaspoon of salt has about 2,300 mg of sodium.  ?? Take the salt shaker off the table.  ?? Flavor your food with garlic, lemon juice, onion, vinegar, herbs, and spices. Do not use soy sauce, lite soy sauce, steak sauce, onion salt, garlic salt, celery salt, mustard, or ketchup on your food.  ?? Use low-sodium salad dressings, sauces, and ketchup. Or make your own salad dressings and sauces without adding salt.  ?? Use less salt (or none) when recipes call for it. You can often use half  the salt a recipe calls for without losing flavor. Other foods such as rice, pasta, and grains do not need added salt.  ?? Rinse canned vegetables, and cook them in fresh water. This removes some???but not all???of the salt.  ?? Avoid water that is naturally high in sodium or that has been treated with water softeners, which add sodium. Call your local water company to find out the sodium content of your water supply. If you buy bottled water, read the label and choose a sodium-free brand.  Avoid high-sodium foods  ?? Avoid eating:  ? Smoked, cured, salted, and canned meat, fish, and poultry.  ? Ham, bacon, hot dogs, and luncheon meats.  ? Regular, hard, and processed cheese and regular peanut butter.  ? Crackers with salted tops, and other salted snack foods such as pretzels, chips, and salted popcorn.  ? Frozen prepared meals, unless labeled low-sodium.  ? Canned and dried soups, broths, and bouillon, unless labeled sodium-free or low-sodium.  ? Canned vegetables, unless labeled sodium-free or low-sodium.  ? French fries, pizza, tacos, and other fast foods.  ? Pickles, olives, ketchup, and other condiments, especially soy sauce, unless labeled sodium-free or low-sodium.  Where can you learn more?  Go to https://www.healthwise.net/[facility]ctions  Enter V843 in the search box to learn more about "Low Sodium Diet (2,000 Milligram): Care Instructions."  Current as of: November 12, 2017??????????????????????????????Content Version: 12.6  ?? 2006-2020 [employer].   Care instructions adapted under license by [employer] (which disclaims liability or warranty for this information). If you have questions about a medical condition or this instruction, always ask your healthcare professional. [employer] disclaims any warranty or liability for your use of this information.

## 2019-04-22 MED ORDER — BUSPIRONE 5 MG TAB
5 mg | ORAL_TABLET | Freq: Two times a day (BID) | ORAL | 0 refills | Status: DC
Start: 2019-04-22 — End: 2019-06-13

## 2019-05-18 MED ORDER — LISINOPRIL-HYDROCHLOROTHIAZIDE 20 MG-12.5 MG TAB
ORAL_TABLET | ORAL | 0 refills | Status: DC
Start: 2019-05-18 — End: 2019-08-11

## 2019-05-18 MED ORDER — ATORVASTATIN 20 MG TAB
20 mg | ORAL_TABLET | ORAL | 0 refills | Status: DC
Start: 2019-05-18 — End: 2019-08-11

## 2019-06-13 ENCOUNTER — Ambulatory Visit
Admit: 2019-06-13 | Discharge: 2019-06-13 | Payer: PRIVATE HEALTH INSURANCE | Attending: Family Medicine | Primary: Family Medicine

## 2019-06-13 ENCOUNTER — Ambulatory Visit: Attending: Family Medicine | Primary: Family Medicine

## 2019-06-13 DIAGNOSIS — K2100 Gastro-esophageal reflux disease with esophagitis, without bleeding: Secondary | ICD-10-CM

## 2019-06-13 MED ORDER — BUSPIRONE 15 MG TAB
15 mg | ORAL_TABLET | Freq: Every day | ORAL | 1 refills | Status: DC | PRN
Start: 2019-06-13 — End: 2019-08-10

## 2019-06-13 NOTE — Progress Notes (Signed)
Teresa Beasley is a 39 y.o. female and presents with Follow Up Chronic Condition, Hypertension, and Medication Refill  .  HPI     Subjective:  Cardiovascular Review:  The patient has hypertension   Diet and Lifestyle: generally follows a low fat low cholesterol diet, generally follows a low sodium diet, exercises sporadically  Home BP Monitoring: is not measured at home.  Pertinent ROS: taking medications as instructed, no medication side effects noted, no TIA's, no chest pain on exertion, no dyspnea on exertion, no swelling of ankles.     Review of Systems  Review of Systems   Constitutional: Negative.  Negative for chills and fever.   HENT: Negative.  Negative for congestion, ear discharge, hearing loss, nosebleeds and tinnitus.    Eyes: Negative.  Negative for blurred vision, double vision, photophobia and pain.   Respiratory: Negative.  Negative for cough, hemoptysis and sputum production.    Cardiovascular: Negative.  Negative for chest pain and palpitations.   Gastrointestinal: Negative.  Negative for heartburn, nausea and vomiting.   Genitourinary: Negative.  Negative for dysuria, frequency and urgency.   Musculoskeletal: Negative.  Negative for back pain and myalgias.   Skin: Negative.    Neurological: Negative.  Negative for dizziness, tingling, weakness and headaches.   Endo/Heme/Allergies: Negative.    Psychiatric/Behavioral: Negative.  Negative for depression and suicidal ideas. The patient does not have insomnia.    All other systems reviewed and are negative.        Past Medical History:   Diagnosis Date   ??? Acquired hypothyroidism 11/02/2018   ??? Bilateral ovarian cysts     Removal 03/24/2010   ??? Ear problems    ??? Essential hypertension 11/02/2018   ??? Gastro-esophageal reflux disease with esophagitis 11/02/2018   ??? Generalized anxiety disorder 11/02/2018   ??? GERD (gastroesophageal reflux disease)    ??? Hypercholesterolemia    ??? Hypernatremia 11/02/2018   ??? Obesity 11/02/2018   ??? Obesity (BMI 30-39.9)  11/02/2018   ??? Otalgia of right ear 11/02/2018   ??? URI (upper respiratory infection) 11/02/2018     No past surgical history on file.  Social History     Socioeconomic History   ??? Marital status: MARRIED     Spouse name: Not on file   ??? Number of children: Not on file   ??? Years of education: Not on file   ??? Highest education level: Not on file   Tobacco Use   ??? Smoking status: Never Smoker   ??? Smokeless tobacco: Never Used     Family History   Problem Relation Age of Onset   ??? Hypertension Mother    ??? MS Father      Current Outpatient Medications   Medication Sig Dispense Refill   ??? lisinopril-hydroCHLOROthiazide (PRINZIDE, ZESTORETIC) 20-12.5 mg per tablet TAKE 1 TABLET BY MOUTH EVERY DAY 90 Tab 0   ??? atorvastatin (LIPITOR) 20 mg tablet TAKE 1 TABLET BY MOUTH EVERY DAY 90 Tab 0   ??? ethinyl estradiol-etonogestrel (NuvaRing) 0.12-0.015 mg/24 hr vaginal ring by Intravaginal route.     ??? omeprazole (PRILOSEC) 40 mg capsule Take 40 mg by mouth daily.       No Known Allergies    Objective:  Visit Vitals  BP (!) 148/68 (BP 1 Location: Right upper arm, BP Patient Position: Sitting, BP Cuff Size: Adult)   Pulse 78   Temp 98 ??F (36.7 ??C) (Temporal)   Resp 18   Ht 5\' 7"  (1.702 m)  Wt 216 lb 6.4 oz (98.2 kg)   LMP 06/08/2019   SpO2 98% Comment: room air   BMI 33.89 kg/m??       Physical Exam:   Physical Exam  Vitals signs and nursing note reviewed.   Constitutional:       Appearance: Normal appearance. She is obese.   HENT:      Head: Normocephalic and atraumatic.      Right Ear: Tympanic membrane, ear canal and external ear normal.      Left Ear: Tympanic membrane, ear canal and external ear normal.      Nose: Nose normal.      Mouth/Throat:      Mouth: Mucous membranes are moist.      Pharynx: Oropharynx is clear. No oropharyngeal exudate or posterior oropharyngeal erythema.   Eyes:      General: No scleral icterus.        Right eye: No discharge.         Left eye: No discharge.      Extraocular Movements: Extraocular movements  intact.      Conjunctiva/sclera: Conjunctivae normal.      Pupils: Pupils are equal, round, and reactive to light.   Neck:      Musculoskeletal: Normal range of motion and neck supple. No neck rigidity or muscular tenderness.      Vascular: No carotid bruit.   Cardiovascular:      Rate and Rhythm: Normal rate.      Pulses: Normal pulses.      Heart sounds: Normal heart sounds. No murmur. No gallop.    Pulmonary:      Effort: Pulmonary effort is normal. No respiratory distress.      Breath sounds: Normal breath sounds. No stridor. No wheezing, rhonchi or rales.   Chest:      Chest wall: No tenderness.   Abdominal:      General: Bowel sounds are normal. There is no distension.      Palpations: Abdomen is soft. There is no mass.      Tenderness: There is no abdominal tenderness. There is no right CVA tenderness, left CVA tenderness or rebound.      Hernia: No hernia is present.   Musculoskeletal: Normal range of motion.         General: No swelling, tenderness, deformity or signs of injury.      Right lower leg: No edema.      Left lower leg: No edema.   Lymphadenopathy:      Cervical: No cervical adenopathy.   Skin:     General: Skin is warm.      Capillary Refill: Capillary refill takes 2 to 3 seconds.      Coloration: Skin is not jaundiced or pale.      Findings: No bruising, erythema, lesion or rash.   Neurological:      General: No focal deficit present.      Mental Status: She is alert and oriented to person, place, and time.      Cranial Nerves: No cranial nerve deficit.      Sensory: No sensory deficit.      Motor: No weakness.      Coordination: Coordination normal.      Gait: Gait normal.      Deep Tendon Reflexes: Reflexes normal.   Psychiatric:         Mood and Affect: Mood normal.         Behavior: Behavior normal.  Thought Content: Thought content normal.         Judgment: Judgment normal.             Results for orders placed or performed in visit on 11/12/18   LIPID PANEL   Result Value Ref Range     Cholesterol, total 119 100 - 199 mg/dL    Triglyceride 49 0 - 149 mg/dL    HDL Cholesterol 42 >28 mg/dL    VLDL, calculated 12 5 - 40 mg/dL    LDL, calculated 65 0 - 99 mg/dL   METABOLIC PANEL, BASIC   Result Value Ref Range    Glucose 95 65 - 99 mg/dL    BUN 8 6 - 20 mg/dL    Creatinine 4.13 2.44 - 1.00 mg/dL    GFR est non-AA 010 >27 mL/min/1.73    GFR est AA 127 >59 mL/min/1.73    BUN/Creatinine ratio 11 9 - 23    Sodium 140 134 - 144 mmol/L    Potassium 4.1 3.5 - 5.2 mmol/L    Chloride 102 96 - 106 mmol/L    CO2 23 20 - 29 mmol/L    Calcium 9.3 8.7 - 10.2 mg/dL   TSH 3RD GENERATION   Result Value Ref Range    TSH 2.200 0.450 - 4.500 uIU/mL   T4 (THYROXINE)   Result Value Ref Range    T4, Total 7.8 4.5 - 12.0 ug/dL       Assessment/Plan:    ICD-10-CM ICD-9-CM    1. Gastroesophageal reflux disease with esophagitis without hemorrhage  K21.00 530.81      530.10    2. Acquired hypothyroidism  E03.9 244.9    3. Essential (primary) hypertension  I10 401.9    4. Generalized anxiety disorder  F41.1 300.02    5. Obesity (BMI 30-39.9)  E66.9 278.00    6. Mixed hyperlipidemia  E78.2 272.2      No orders of the defined types were placed in this encounter.    Cannot display discharge medications since this is not an admission.

## 2019-06-13 NOTE — Progress Notes (Signed)
Teresa Beasley is a 39 y.o. female and presents with Follow Up Chronic Condition, Hypertension, and Medication Refill  .  HPI     Subjective:  Cardiovascular Review:  The patient has hypertension   Diet and Lifestyle: generally follows a low fat low cholesterol diet, generally follows a low sodium diet, exercises sporadically  Home BP Monitoring: is not measured at home.  Pertinent ROS: taking medications as instructed, no medication side effects noted, no TIA's, no chest pain on exertion, no dyspnea on exertion, no swelling of ankles.     Review of Systems  Review of Systems   Constitutional: Negative.  Negative for chills and fever.   HENT: Negative.  Negative for congestion, ear discharge, hearing loss, nosebleeds and tinnitus.    Eyes: Negative.  Negative for blurred vision, double vision, photophobia and pain.   Respiratory: Negative.  Negative for cough, hemoptysis and sputum production.    Cardiovascular: Negative.  Negative for chest pain and palpitations.   Gastrointestinal: Negative.  Negative for heartburn, nausea and vomiting.   Genitourinary: Negative.  Negative for dysuria, frequency and urgency.   Musculoskeletal: Negative.  Negative for back pain and myalgias.   Skin: Negative.    Neurological: Negative.  Negative for dizziness, tingling, weakness and headaches.   Endo/Heme/Allergies: Negative.    Psychiatric/Behavioral: Negative.  Negative for depression and suicidal ideas. The patient does not have insomnia.    All other systems reviewed and are negative.        Past Medical History:   Diagnosis Date   ??? Acquired hypothyroidism 11/02/2018   ??? Bilateral ovarian cysts     Removal 03/24/2010   ??? Ear problems    ??? Essential hypertension 11/02/2018   ??? Gastro-esophageal reflux disease with esophagitis 11/02/2018   ??? Generalized anxiety disorder 11/02/2018   ??? GERD (gastroesophageal reflux disease)    ??? Hypercholesterolemia    ??? Hypernatremia 11/02/2018   ??? Obesity 11/02/2018   ??? Obesity (BMI 30-39.9)  11/02/2018   ??? Otalgia of right ear 11/02/2018   ??? URI (upper respiratory infection) 11/02/2018     No past surgical history on file.  Social History     Socioeconomic History   ??? Marital status: MARRIED     Spouse name: Not on file   ??? Number of children: Not on file   ??? Years of education: Not on file   ??? Highest education level: Not on file   Tobacco Use   ??? Smoking status: Never Smoker   ??? Smokeless tobacco: Never Used     Family History   Problem Relation Age of Onset   ??? Hypertension Mother    ??? MS Father      Current Outpatient Medications   Medication Sig Dispense Refill   ??? lisinopril-hydroCHLOROthiazide (PRINZIDE, ZESTORETIC) 20-12.5 mg per tablet TAKE 1 TABLET BY MOUTH EVERY DAY 90 Tab 0   ??? atorvastatin (LIPITOR) 20 mg tablet TAKE 1 TABLET BY MOUTH EVERY DAY 90 Tab 0   ??? ethinyl estradiol-etonogestrel (NuvaRing) 0.12-0.015 mg/24 hr vaginal ring by Intravaginal route.     ??? omeprazole (PRILOSEC) 40 mg capsule Take 40 mg by mouth daily.       No Known Allergies    Objective:  Visit Vitals  BP (!) 148/68 (BP 1 Location: Right upper arm, BP Patient Position: Sitting, BP Cuff Size: Adult)   Pulse 78   Temp 98 ??F (36.7 ??C) (Temporal)   Resp 18   Ht 5\' 7"  (1.702 m)  Wt 216 lb 6.4 oz (98.2 kg)   LMP 06/08/2019   SpO2 98% Comment: room air   BMI 33.89 kg/m??       Physical Exam:   Physical Exam  Vitals signs and nursing note reviewed.   Constitutional:       Appearance: Normal appearance. She is obese.   HENT:      Head: Normocephalic and atraumatic.      Right Ear: Tympanic membrane, ear canal and external ear normal.      Left Ear: Tympanic membrane, ear canal and external ear normal.      Nose: Nose normal.      Mouth/Throat:      Mouth: Mucous membranes are moist.      Pharynx: Oropharynx is clear. No oropharyngeal exudate or posterior oropharyngeal erythema.   Eyes:      General: No scleral icterus.        Right eye: No discharge.         Left eye: No discharge.      Extraocular Movements: Extraocular movements  intact.      Conjunctiva/sclera: Conjunctivae normal.      Pupils: Pupils are equal, round, and reactive to light.   Neck:      Musculoskeletal: Normal range of motion and neck supple. No neck rigidity or muscular tenderness.      Vascular: No carotid bruit.   Cardiovascular:      Rate and Rhythm: Normal rate.      Pulses: Normal pulses.      Heart sounds: Normal heart sounds. No murmur. No gallop.    Pulmonary:      Effort: Pulmonary effort is normal. No respiratory distress.      Breath sounds: Normal breath sounds. No stridor. No wheezing, rhonchi or rales.   Chest:      Chest wall: No tenderness.   Abdominal:      General: Bowel sounds are normal. There is no distension.      Palpations: Abdomen is soft. There is no mass.      Tenderness: There is no abdominal tenderness. There is no right CVA tenderness, left CVA tenderness or rebound.      Hernia: No hernia is present.   Musculoskeletal: Normal range of motion.         General: No swelling, tenderness, deformity or signs of injury.      Right lower leg: No edema.      Left lower leg: No edema.   Lymphadenopathy:      Cervical: No cervical adenopathy.   Skin:     General: Skin is warm.      Capillary Refill: Capillary refill takes 2 to 3 seconds.      Coloration: Skin is not jaundiced or pale.      Findings: No bruising, erythema, lesion or rash.   Neurological:      General: No focal deficit present.      Mental Status: She is alert and oriented to person, place, and time.      Cranial Nerves: No cranial nerve deficit.      Sensory: No sensory deficit.      Motor: No weakness.      Coordination: Coordination normal.      Gait: Gait normal.      Deep Tendon Reflexes: Reflexes normal.   Psychiatric:         Mood and Affect: Mood normal.         Behavior: Behavior normal.           Thought Content: Thought content normal.         Judgment: Judgment normal.             Results for orders placed or performed in visit on 11/12/18   LIPID PANEL   Result Value Ref Range     Cholesterol, total 119 100 - 199 mg/dL    Triglyceride 49 0 - 149 mg/dL    HDL Cholesterol 42 >28 mg/dL    VLDL, calculated 12 5 - 40 mg/dL    LDL, calculated 65 0 - 99 mg/dL   METABOLIC PANEL, BASIC   Result Value Ref Range    Glucose 95 65 - 99 mg/dL    BUN 8 6 - 20 mg/dL    Creatinine 4.13 2.44 - 1.00 mg/dL    GFR est non-AA 010 >27 mL/min/1.73    GFR est AA 127 >59 mL/min/1.73    BUN/Creatinine ratio 11 9 - 23    Sodium 140 134 - 144 mmol/L    Potassium 4.1 3.5 - 5.2 mmol/L    Chloride 102 96 - 106 mmol/L    CO2 23 20 - 29 mmol/L    Calcium 9.3 8.7 - 10.2 mg/dL   TSH 3RD GENERATION   Result Value Ref Range    TSH 2.200 0.450 - 4.500 uIU/mL   T4 (THYROXINE)   Result Value Ref Range    T4, Total 7.8 4.5 - 12.0 ug/dL       Assessment/Plan:    ICD-10-CM ICD-9-CM    1. Gastroesophageal reflux disease with esophagitis without hemorrhage  K21.00 530.81      530.10    2. Acquired hypothyroidism  E03.9 244.9    3. Essential (primary) hypertension  I10 401.9    4. Generalized anxiety disorder  F41.1 300.02    5. Obesity (BMI 30-39.9)  E66.9 278.00    6. Mixed hyperlipidemia  E78.2 272.2      No orders of the defined types were placed in this encounter.    Cannot display discharge medications since this is not an admission.

## 2019-08-10 MED ORDER — BUSPIRONE 15 MG TAB
15 mg | ORAL_TABLET | ORAL | 1 refills | Status: DC
Start: 2019-08-10 — End: 2019-09-26

## 2019-08-11 MED ORDER — LISINOPRIL-HYDROCHLOROTHIAZIDE 20 MG-12.5 MG TAB
ORAL_TABLET | ORAL | 0 refills | Status: DC
Start: 2019-08-11 — End: 2019-09-13

## 2019-08-11 MED ORDER — ATORVASTATIN 20 MG TAB
20 mg | ORAL_TABLET | ORAL | 0 refills | Status: DC
Start: 2019-08-11 — End: 2019-09-13

## 2019-08-20 MED ORDER — LEVOTHYROXINE 25 MCG TAB
25 mcg | ORAL_TABLET | ORAL | 0 refills | Status: DC
Start: 2019-08-20 — End: 2019-09-26

## 2019-09-12 ENCOUNTER — Telehealth
Admit: 2019-09-12 | Discharge: 2019-09-12 | Payer: PRIVATE HEALTH INSURANCE | Attending: Family Medicine | Primary: Family Medicine

## 2019-09-12 ENCOUNTER — Telehealth: Attending: Family Medicine | Primary: Family Medicine

## 2019-09-12 DIAGNOSIS — K2100 Gastro-esophageal reflux disease with esophagitis, without bleeding: Secondary | ICD-10-CM

## 2019-09-12 NOTE — Progress Notes (Signed)
Teresa Beasley is a 39 y.o. female who was seen by synchronous (real-time) audio-video technology on 09/12/2019 for Follow Up Chronic Condition, Hypertension, and Medication Refill (Buspirone )        Assessment & Plan:   The complexity of medical decision making for this visit is moderate     I spent at least 12 minutes on this visit with this established patient.    Subjective:   39 yo WF with a hx of hyperlipidemia,GERD and anxiety that patient states has improved since I started her on Buspar in patient also with a history of hypothyroidism.Pt states current dose of Synthroid has been working well    Prior to Admission medications    Medication Sig Start Date End Date Taking? Authorizing Provider   levothyroxine (SYNTHROID) 25 mcg tablet TAKE 1 TABLET BY MOUTH DAILY (BEFORE BREAKFAST) 08/20/19   Lawrence Santiago, MD   lisinopril-hydroCHLOROthiazide Adline Potter, ZESTORETIC) 20-12.5 mg per tablet TAKE 1 TABLET BY MOUTH EVERY DAY 08/11/19   Lawrence Santiago, MD   atorvastatin (LIPITOR) 20 mg tablet TAKE 1 TABLET BY MOUTH EVERY DAY 08/11/19   Lawrence Santiago, MD   busPIRone (BUSPAR) 15 mg tablet TAKE 1 TABLET BY MOUTH DAILY AS NEEDED (ANXIETY). 08/10/19   Lawrence Santiago, MD   ethinyl estradiol-etonogestrel (NuvaRing) 0.12-0.015 mg/24 hr vaginal ring by Intravaginal route.    Provider, Historical   omeprazole (PRILOSEC) 40 mg capsule Take 40 mg by mouth daily.    Provider, Historical     Patient Active Problem List   Diagnosis Code   ??? Obesity (BMI 30-39.9) E66.9   ??? Essential hypertension I10   ??? Gastro-esophageal reflux disease with esophagitis K21.00   ??? Generalized anxiety disorder F41.1   ??? Acquired hypothyroidism E03.9   ??? Obesity E66.9   ??? Otalgia of right ear H92.01   ??? URI (upper respiratory infection) J06.9   ??? Ear problems H93.90   ??? GERD (gastroesophageal reflux disease) K21.9   ??? Hypernatremia E87.0   ??? Anxiety F41.9   ??? Essential (primary) hypertension I10   ??? Mixed hyperlipidemia E78.2       Review of  Systems   Constitutional: Negative.  Negative for chills and fever.   HENT: Negative.  Negative for congestion, ear discharge, hearing loss, nosebleeds and tinnitus.    Eyes: Negative.  Negative for blurred vision, double vision, photophobia and pain.   Respiratory: Negative.  Negative for cough, hemoptysis and sputum production.    Cardiovascular: Negative.  Negative for chest pain and palpitations.   Gastrointestinal: Negative.  Negative for heartburn, nausea and vomiting.   Genitourinary: Negative.  Negative for dysuria, frequency and urgency.   Musculoskeletal: Negative.  Negative for back pain and myalgias.   Skin: Negative.    Neurological: Negative.  Negative for dizziness, tingling, weakness and headaches.   Endo/Heme/Allergies: Negative.    Psychiatric/Behavioral: Negative for depression and suicidal ideas. The patient is nervous/anxious. The patient does not have insomnia.    All other systems reviewed and are negative.      Objective:   No flowsheet data found.     [INSTRUCTIONS:  "[x] " Indicates a positive item  "[] " Indicates a negative item  -- DELETE ALL ITEMS NOT EXAMINED]    Constitutional: [x]  Appears well-developed and well-nourished [x]  No apparent distress      []  Abnormal -     Mental status: [x]  Alert and awake  [x]  Oriented to person/place/time [x]  Able to follow commands    []  Abnormal -  Eyes:   EOM    [x]   Normal    []  Abnormal -   Sclera  [x]   Normal    []  Abnormal -          Discharge [x]   None visible   []  Abnormal -     HENT: [x]  Normocephalic, atraumatic  []  Abnormal -   [x]  Mouth/Throat: Mucous membranes are moist    External Ears [x]  Normal  []  Abnormal -    Neck: [x]  No visualized mass []  Abnormal -     Pulmonary/Chest: [x]  Respiratory effort normal   [x]  No visualized signs of difficulty breathing or respiratory distress        []  Abnormal -      Musculoskeletal:   [x]  Normal gait with no signs of ataxia         [x]  Normal range of motion of neck        []  Abnormal -      Neurological:        [x]  No Facial Asymmetry (Cranial nerve 7 motor function) (limited exam due to video visit)          [x]  No gaze palsy        []  Abnormal -          Skin:        [x]  No significant exanthematous lesions or discoloration noted on facial skin         []  Abnormal -            Psychiatric:       [x]  Normal Affect []  Abnormal -        [x]  No Hallucinations    Other pertinent observable physical exam findings:-        We discussed the expected course, resolution and complications of the diagnosis(es) in detail.  Medication risks, benefits, costs, interactions, and alternatives were discussed as indicated.  I advised her to contact the office if her condition worsens, changes or fails to improve as anticipated. She expressed understanding with the diagnosis(es) and plan.       Landis Gandy, was evaluated through a synchronous (real-time) audio-video encounter. The patient (or guardian if applicable) is aware that this is a billable service. Verbal consent to proceed has been obtained within the past 12 months. The visit was conducted pursuant to the emergency declaration under the [place] waiver authority and the [employer] Act.  Patient identification was verified, and a caregiver was present when appropriate. The patient was located in a state where the provider was credentialed to provide care.      Karolee Stamps, MD

## 2019-09-13 MED ORDER — LISINOPRIL-HYDROCHLOROTHIAZIDE 20 MG-12.5 MG TAB
ORAL_TABLET | ORAL | 0 refills | Status: DC
Start: 2019-09-13 — End: 2020-01-16

## 2019-09-13 MED ORDER — ATORVASTATIN 20 MG TAB
20 mg | ORAL_TABLET | ORAL | 0 refills | Status: DC
Start: 2019-09-13 — End: 2020-02-07

## 2019-09-26 MED ORDER — BUSPIRONE 15 MG TAB
15 mg | ORAL_TABLET | ORAL | 1 refills | Status: DC
Start: 2019-09-26 — End: 2019-12-05

## 2019-09-26 MED ORDER — LEVOTHYROXINE 25 MCG TAB
25 mcg | ORAL_TABLET | ORAL | 0 refills | Status: DC
Start: 2019-09-26 — End: 2020-02-07

## 2019-12-05 MED ORDER — BUSPIRONE 15 MG TAB
15 mg | ORAL_TABLET | ORAL | 1 refills | Status: DC
Start: 2019-12-05 — End: 2020-01-03

## 2019-12-11 LAB — COMPREHENSIVE METABOLIC PANEL
ALT: 11 IU/L (ref 0–32)
AST: 14 IU/L (ref 0–40)
Albumin/Globulin Ratio: 1.7 NA (ref 1.2–2.2)
Albumin: 4.2 g/dL (ref 3.8–4.8)
Alkaline Phosphatase: 72 IU/L (ref 44–121)
BUN: 10 mg/dL (ref 6–20)
Bun/Cre Ratio: 14 NA (ref 9–23)
CO2: 22 mmol/L (ref 20–29)
Calcium: 9.1 mg/dL (ref 8.7–10.2)
Chloride: 106 mmol/L (ref 96–106)
Creatinine: 0.71 mg/dL (ref 0.57–1.00)
EGFR IF NonAfrican American: 108 mL/min/{1.73_m2} (ref >59)
GFR African American: 125 mL/min/{1.73_m2} (ref >59)
Globulin, Total: 2.5 g/dL (ref 1.5–4.5)
Glucose: 98 mg/dL (ref 65–99)
Potassium: 4.5 mmol/L (ref 3.5–5.2)
Sodium: 142 mmol/L (ref 134–144)
Total Bilirubin: 0.3 mg/dL (ref 0.0–1.2)
Total Protein: 6.7 g/dL (ref 6.0–8.5)

## 2019-12-11 LAB — LIPID PANEL
Cholesterol, Total: 147 mg/dL (ref 100–199)
Cholesterol, total: 147 mg/dL (ref 100–199)
HDL Cholesterol: 49 mg/dL (ref >39)
HDL: 49 mg/dL (ref >39)
LDL Calculated: 84 mg/dL (ref 0–99)
LDL, calculated: 84 mg/dL (ref 0–99)
Triglyceride: 68 mg/dL (ref 0–149)
Triglycerides: 68 mg/dL (ref 0–149)
VLDL, calculated: 14 mg/dL (ref 5–40)
VLDL: 14 mg/dL (ref 5–40)

## 2019-12-11 LAB — T4: T4, Total: 11.2 ug/dL (ref 4.5–12.0)

## 2019-12-11 LAB — TSH 3RD GENERATION
TSH: 2.08 u[IU]/mL (ref 0.450–4.500)
TSH: 2.08 u[IU]/mL (ref 0.450–4.500)

## 2019-12-11 LAB — METABOLIC PANEL, COMPREHENSIVE
A-G Ratio: 1.7 (ref 1.2–2.2)
ALT (SGPT): 11 IU/L (ref 0–32)
AST (SGOT): 14 IU/L (ref 0–40)
Albumin: 4.2 g/dL (ref 3.8–4.8)
Alk. phosphatase: 72 IU/L (ref 44–121)
BUN/Creatinine ratio: 14 (ref 9–23)
BUN: 10 mg/dL (ref 6–20)
Bilirubin, total: 0.3 mg/dL (ref 0.0–1.2)
CO2: 22 mmol/L (ref 20–29)
Calcium: 9.1 mg/dL (ref 8.7–10.2)
Chloride: 106 mmol/L (ref 96–106)
Creatinine: 0.71 mg/dL (ref 0.57–1.00)
GFR est AA: 125 mL/min/{1.73_m2} (ref >59)
GFR est non-AA: 108 mL/min/{1.73_m2} (ref >59)
GLOBULIN, TOTAL: 2.5 g/dL (ref 1.5–4.5)
Glucose: 98 mg/dL (ref 65–99)
Potassium: 4.5 mmol/L (ref 3.5–5.2)
Protein, total: 6.7 g/dL (ref 6.0–8.5)
Sodium: 142 mmol/L (ref 134–144)

## 2019-12-11 LAB — T4 (THYROXINE): T4, Total: 11.2 ug/dL (ref 4.5–12.0)

## 2019-12-19 ENCOUNTER — Encounter: Payer: PRIVATE HEALTH INSURANCE | Attending: Family Medicine | Primary: Family Medicine

## 2020-01-03 MED ORDER — BUSPIRONE 15 MG TAB
15 mg | ORAL_TABLET | ORAL | 0 refills | Status: DC
Start: 2020-01-03 — End: 2020-01-16

## 2020-01-03 NOTE — Telephone Encounter (Signed)
90-day supply request

## 2020-01-16 ENCOUNTER — Ambulatory Visit
Admit: 2020-01-16 | Discharge: 2020-01-16 | Payer: PRIVATE HEALTH INSURANCE | Attending: Family Medicine | Primary: Family Medicine

## 2020-01-16 ENCOUNTER — Ambulatory Visit: Attending: Family Medicine | Primary: Family Medicine

## 2020-01-16 DIAGNOSIS — K2100 Gastro-esophageal reflux disease with esophagitis, without bleeding: Secondary | ICD-10-CM

## 2020-01-16 MED ORDER — LISINOPRIL-HYDROCHLOROTHIAZIDE 20 MG-12.5 MG TAB
ORAL_TABLET | Freq: Every day | ORAL | 0 refills | Status: DC
Start: 2020-01-16 — End: 2020-04-12

## 2020-01-16 MED ORDER — BUSPIRONE 15 MG TAB
15 mg | ORAL_TABLET | ORAL | 0 refills | Status: DC
Start: 2020-01-16 — End: 2020-04-27

## 2020-01-16 NOTE — Progress Notes (Signed)
 Teresa Beasley is a 39 y.o. female    Chief Complaint   Patient presents with   . Follow-up     3 mo   . Cholesterol Problem   . Thyroid Problem   . GERD       Health Maintenance Due   Topic Date Due   . Hepatitis C Screening  Never done   . DTaP/Tdap/Td series (1 - Tdap) Never done   . Cervical cancer screen  Never done       Visit Vitals  BP 118/86 (BP 1 Location: Left upper arm, BP Patient Position: Sitting)   Pulse 99   Temp 97.5 F (36.4 C) (Temporal)   Resp 12   Ht 5' 7 (1.702 m)   Wt 213 lb (96.6 kg)   SpO2 98%   BMI 33.36 kg/m       3 most recent PHQ Screens 01/16/2020   Little interest or pleasure in doing things Not at all   Feeling down, depressed, irritable, or hopeless Not at all   Total Score PHQ 2 0       No flowsheet data found.    Abuse Screening Questionnaire 01/16/2020   Do you ever feel afraid of your partner? N   Are you in a relationship with someone who physically or mentally threatens you? N   Is it safe for you to go home? Y         1. Have you been to the ER, urgent care clinic since your last visit?  Hospitalized since your last visit?no    2. Have you seen or consulted any other health care providers outside of the [facility] since your last visit?  Include any pap smears or colon screening. Yes OBGYN NP Heather Lawrence

## 2020-01-16 NOTE — Progress Notes (Signed)
HPI    Teresa Beasley is a 39 y.o. female and presents today for Follow-up (3 mo), Cholesterol Problem, Thyroid Problem, and GERD  .  HPI   Allergies    No Known Allergies     Medications    Current Outpatient Medications   Medication Sig Dispense   ??? lisinopril-hydroCHLOROthiazide (PRINZIDE, ZESTORETIC) 20-12.5 mg per tablet Take 1 Tablet by mouth daily. 90 Tablet   ??? busPIRone (BUSPAR) 15 mg tablet TAKE 1 TABLET BY MOUTH DAILY AS NEEDED (ANXIETY). 90 Tablet   ??? levothyroxine (SYNTHROID) 25 mcg tablet TAKE 1 TABLET BY MOUTH DAILY (BEFORE BREAKFAST) 90 Tablet   ??? atorvastatin (LIPITOR) 20 mg tablet TAKE 1 TABLET BY MOUTH EVERY DAY 90 Tablet   ??? ethinyl estradiol-etonogestrel (NuvaRing) 0.12-0.015 mg/24 hr vaginal ring by Intravaginal route.    ??? omeprazole (PRILOSEC) 40 mg capsule Take 40 mg by mouth daily.      No current facility-administered medications for this visit.        Health Maintenance    Health Maintenance Due   Topic Date Due   ??? Hepatitis C Screening  Never done   ??? DTaP/Tdap/Td series (1 - Tdap) Never done   ??? Cervical cancer screen  Never done        Problem List    Patient Active Problem List    Diagnosis Date Noted   ??? Mixed hyperlipidemia 02/23/2019   ??? Anxiety 11/12/2018   ??? Essential (primary) hypertension 11/12/2018   ??? Obesity (BMI 30-39.9) 11/02/2018   ??? Gastro-esophageal reflux disease with esophagitis 11/02/2018   ??? Generalized anxiety disorder 11/02/2018   ??? Acquired hypothyroidism 11/02/2018   ??? Otalgia of right ear 11/02/2018   ??? URI (upper respiratory infection) 11/02/2018   ??? Hypernatremia 11/02/2018   ??? Ear problems    ??? GERD (gastroesophageal reflux disease)         Family Hx    Family History   Problem Relation Age of Onset   ??? Hypertension Mother    ??? MS Father         Social Hx    Social History     Socioeconomic History   ??? Marital status: MARRIED     Spouse name: Not on file   ??? Number of children: Not on file   ??? Years of education: Not on file   ??? Highest education level: Not  on file   Tobacco Use   ??? Smoking status: Never Smoker   ??? Smokeless tobacco: Never Used     Social Determinants of Psychologist, prison and probation services Strain:    ??? Difficulty of Paying Living Expenses:    Food Insecurity:    ??? Worried About Programme researcher, broadcasting/film/video in the Last Year:    ??? Barista in the Last Year:    Transportation Needs:    ??? Freight forwarder (Medical):    ??? Lack of Transportation (Non-Medical):    Physical Activity:    ??? Days of Exercise per Week:    ??? Minutes of Exercise per Session:    Stress:    ??? Feeling of Stress :    Social Connections:    ??? Frequency of Communication with Friends and Family:    ??? Frequency of Social Gatherings with Friends and Family:    ??? Attends Religious Services:    ??? Database administrator or Organizations:    ??? Attends Banker Meetings:    ???  Marital Status:         Surgical Hx    No past surgical history on file.       Vitals    Visit Vitals  BP 118/86 (BP 1 Location: Left upper arm, BP Patient Position: Sitting)   Pulse 99   Temp 97.5 ??F (36.4 ??C) (Temporal)   Resp 12   Ht 5\' 7"  (1.702 m)   Wt 213 lb (96.6 kg)   SpO2 98%   BMI 33.36 kg/m??        ROS    Review of Systems   Constitutional: Negative.  Negative for chills and fever.   HENT: Negative.  Negative for congestion, ear discharge, hearing loss, nosebleeds and tinnitus.    Eyes: Negative.  Negative for blurred vision, double vision, photophobia and pain.   Respiratory: Negative.  Negative for cough, hemoptysis and sputum production.    Cardiovascular: Negative.  Negative for chest pain and palpitations.   Gastrointestinal: Negative.  Negative for heartburn, nausea and vomiting.   Genitourinary: Negative.  Negative for dysuria, frequency and urgency.   Musculoskeletal: Negative.  Negative for back pain and myalgias.   Skin: Negative.    Neurological: Negative.  Negative for dizziness, tingling, weakness and headaches.   Endo/Heme/Allergies: Negative.    Psychiatric/Behavioral: Negative.  Negative  for depression and suicidal ideas. The patient does not have insomnia.    All other systems reviewed and are negative.        Physical Exam      Physical Exam  Vitals and nursing note reviewed.   Constitutional:       Appearance: Normal appearance. She is obese.   HENT:      Head: Normocephalic and atraumatic.      Right Ear: Tympanic membrane, ear canal and external ear normal.      Left Ear: Tympanic membrane, ear canal and external ear normal.      Nose: Nose normal.      Mouth/Throat:      Mouth: Mucous membranes are moist.      Pharynx: Oropharynx is clear. No oropharyngeal exudate or posterior oropharyngeal erythema.   Eyes:      General: No scleral icterus.        Right eye: No discharge.         Left eye: No discharge.      Extraocular Movements: Extraocular movements intact.      Conjunctiva/sclera: Conjunctivae normal.      Pupils: Pupils are equal, round, and reactive to light.   Neck:      Vascular: No carotid bruit.   Cardiovascular:      Rate and Rhythm: Normal rate.      Pulses: Normal pulses.      Heart sounds: Normal heart sounds. No murmur heard.   No gallop.    Pulmonary:      Effort: Pulmonary effort is normal. No respiratory distress.      Breath sounds: Normal breath sounds. No stridor. No wheezing, rhonchi or rales.   Chest:      Chest wall: No tenderness.   Abdominal:      General: Bowel sounds are normal. There is no distension.      Palpations: Abdomen is soft. There is no mass.      Tenderness: There is no abdominal tenderness. There is no right CVA tenderness, left CVA tenderness or rebound.      Hernia: No hernia is present.   Musculoskeletal:  General: No swelling, tenderness, deformity or signs of injury. Normal range of motion.      Cervical back: Normal range of motion and neck supple. No rigidity. No muscular tenderness.      Right lower leg: No edema.      Left lower leg: No edema.   Lymphadenopathy:      Cervical: No cervical adenopathy.   Skin:     General: Skin is warm.       Capillary Refill: Capillary refill takes 2 to 3 seconds.      Coloration: Skin is not jaundiced or pale.      Findings: No bruising, erythema, lesion or rash.   Neurological:      General: No focal deficit present.      Mental Status: She is alert and oriented to person, place, and time.      Cranial Nerves: No cranial nerve deficit.      Sensory: No sensory deficit.      Motor: No weakness.      Coordination: Coordination normal.      Gait: Gait normal.      Deep Tendon Reflexes: Reflexes normal.   Psychiatric:         Mood and Affect: Mood normal.         Behavior: Behavior normal.         Thought Content: Thought content normal.         Judgment: Judgment normal.          Assessment/Plan    Diagnoses and all orders for this visit:    1. Gastroesophageal reflux disease with esophagitis without hemorrhage    2. Acquired hypothyroidism    3. Mixed hyperlipidemia    4. Anxiety    Other orders  -     lisinopril-hydroCHLOROthiazide (PRINZIDE, ZESTORETIC) 20-12.5 mg per tablet; Take 1 Tablet by mouth daily.  -     busPIRone (BUSPAR) 15 mg tablet; TAKE 1 TABLET BY MOUTH DAILY AS NEEDED (ANXIETY).         Health Maintenance Items reviewed with patient as noted.

## 2020-02-07 MED ORDER — ATORVASTATIN 20 MG TAB
20 mg | ORAL_TABLET | ORAL | 0 refills | Status: DC
Start: 2020-02-07 — End: 2020-05-04

## 2020-02-07 MED ORDER — LEVOTHYROXINE 25 MCG TAB
25 mcg | ORAL_TABLET | ORAL | 0 refills | Status: DC
Start: 2020-02-07 — End: 2020-05-04

## 2020-04-11 NOTE — Telephone Encounter (Signed)
Left a message for the patient to call and reschedule her appt since her son tested positive for covid

## 2020-04-12 MED ORDER — LISINOPRIL-HYDROCHLOROTHIAZIDE 20 MG-12.5 MG TAB
ORAL_TABLET | ORAL | 0 refills | Status: DC
Start: 2020-04-12 — End: 2020-07-21

## 2020-04-16 ENCOUNTER — Encounter: Attending: Family Medicine | Primary: Family Medicine

## 2020-04-27 ENCOUNTER — Ambulatory Visit
Admit: 2020-04-27 | Discharge: 2020-04-27 | Payer: PRIVATE HEALTH INSURANCE | Attending: Family Medicine | Primary: Family Medicine

## 2020-04-27 ENCOUNTER — Ambulatory Visit: Attending: Family Medicine | Primary: Family Medicine

## 2020-04-27 DIAGNOSIS — K2100 Gastro-esophageal reflux disease with esophagitis, without bleeding: Secondary | ICD-10-CM

## 2020-04-27 MED ORDER — BUSPIRONE 15 MG TAB
15 mg | ORAL_TABLET | ORAL | 0 refills | Status: DC
Start: 2020-04-27 — End: 2020-07-30

## 2020-04-27 NOTE — Progress Notes (Signed)
HPI    Teresa Beasley is a 40 y.o. female and presents today for Follow-up (3 month) and Depression  .  HPI   Allergies    No Known Allergies     Medications    Current Outpatient Medications   Medication Sig Dispense   ??? busPIRone (BUSPAR) 15 mg tablet TAKE 1 TABLET BY MOUTH DAILY AS NEEDED (ANXIETY). 90 Tablet   ??? lisinopril-hydroCHLOROthiazide (PRINZIDE, ZESTORETIC) 20-12.5 mg per tablet TAKE 1 TABLET BY MOUTH EVERY DAY 90 Tablet   ??? atorvastatin (LIPITOR) 20 mg tablet TAKE 1 TABLET BY MOUTH EVERY DAY 90 Tablet   ??? levothyroxine (SYNTHROID) 25 mcg tablet TAKE 1 TABLET BY MOUTH DAILY (BEFORE BREAKFAST) 90 Tablet   ??? omeprazole (PRILOSEC) 40 mg capsule Take 40 mg by mouth daily.    ??? ethinyl estradiol-etonogestrel (NuvaRing) 0.12-0.015 mg/24 hr vaginal ring by Intravaginal route. (Patient not taking: Reported on 04/27/2020)      No current facility-administered medications for this visit.        Health Maintenance    Health Maintenance Due   Topic Date Due   ??? Hepatitis C Screening  Never done   ??? DTaP/Tdap/Td series (1 - Tdap) Never done   ??? Cervical cancer screen  Never done   ??? COVID-19 Vaccine (3 - Booster for Moderna series) 10/11/2019        Problem List    Patient Active Problem List    Diagnosis Date Noted   ??? 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 Mixed hyperlipidemia 02/23/2019   ??? Anxiety 11/12/2018   ??? Essential (primary) hypertension 11/12/2018   ??? Obesity (BMI 30-39.9) 11/02/2018   ??? Gastro-esophageal reflux disease with esophagitis 11/02/2018   ??? Generalized anxiety disorder 11/02/2018   ??? Acquired hypothyroidism 11/02/2018   ??? Otalgia of right ear 11/02/2018   ??? URI (upper respiratory infection) 11/02/2018   ??? Hypernatremia 11/02/2018   ??? Ear problems    ??? GERD (gastroesophageal reflux disease)         Family Hx    Family History   Problem Relation Age of Onset   ??? Hypertension Mother    ??? Mult Sclerosis Father         Social Hx    Social History     Socioeconomic  History   ??? Marital status: MARRIED   Tobacco Use   ??? Smoking status: Never Smoker   ??? Smokeless tobacco: Never Used        Surgical Hx    History reviewed. No pertinent surgical history.       Vitals    Visit Vitals  BP 122/80 (BP 1 Location: Left upper arm, BP Patient Position: Sitting, BP Cuff Size: Adult)   Pulse 86   Temp 96.9 ??F (36.1 ??C)   Resp 17   Ht 5\' 7"  (1.702 m)   Wt 218 lb (98.9 kg)   LMP 04/22/2020 (Exact Date)   SpO2 99%   BMI 34.14 kg/m??        ROS    Review of Systems   Constitutional: Negative.  Negative for chills and fever.   HENT: Negative.  Negative for congestion, ear discharge, hearing loss, nosebleeds and tinnitus.    Eyes: Negative.  Negative for blurred vision, double vision, photophobia and pain.   Respiratory: Negative.  Negative for cough, hemoptysis and sputum production.    Cardiovascular: Negative.  Negative for chest pain and palpitations.   Gastrointestinal: Negative.  Negative for heartburn,  nausea and vomiting.   Genitourinary: Negative.  Negative for dysuria, frequency and urgency.   Musculoskeletal: Negative.  Negative for back pain and myalgias.   Skin: Negative.    Neurological: Negative.  Negative for dizziness, tingling, weakness and headaches.   Endo/Heme/Allergies: Negative.    Psychiatric/Behavioral: Negative for depression and suicidal ideas. The patient is nervous/anxious. The patient does not have insomnia.    All other systems reviewed and are negative.        Physical Exam      Physical Exam  Vitals and nursing note reviewed.   Constitutional:       Appearance: Normal appearance. She is obese.   HENT:      Head: Normocephalic and atraumatic.      Right Ear: Tympanic membrane, ear canal and external ear normal.      Left Ear: Tympanic membrane, ear canal and external ear normal.      Nose: Nose normal.      Mouth/Throat:      Mouth: Mucous membranes are moist.      Pharynx: Oropharynx is clear. No oropharyngeal exudate or posterior oropharyngeal erythema.   Eyes:       General: No scleral icterus.        Right eye: No discharge.         Left eye: No discharge.      Extraocular Movements: Extraocular movements intact.      Conjunctiva/sclera: Conjunctivae normal.      Pupils: Pupils are equal, round, and reactive to light.   Neck:      Vascular: No carotid bruit.   Cardiovascular:      Rate and Rhythm: Normal rate.      Pulses: Normal pulses.      Heart sounds: Normal heart sounds. No murmur heard.  No gallop.    Pulmonary:      Effort: Pulmonary effort is normal. No respiratory distress.      Breath sounds: Normal breath sounds. No stridor. No wheezing, rhonchi or rales.   Chest:      Chest wall: No tenderness.   Abdominal:      General: Bowel sounds are normal. There is no distension.      Palpations: Abdomen is soft. There is no mass.      Tenderness: There is no abdominal tenderness. There is no right CVA tenderness, left CVA tenderness or rebound.      Hernia: No hernia is present.   Musculoskeletal:         General: No swelling, tenderness, deformity or signs of injury. Normal range of motion.      Cervical back: Normal range of motion and neck supple. No rigidity. No muscular tenderness.      Right lower leg: No edema.      Left lower leg: No edema.   Lymphadenopathy:      Cervical: No cervical adenopathy.   Skin:     General: Skin is warm.      Capillary Refill: Capillary refill takes 2 to 3 seconds.      Coloration: Skin is not jaundiced or pale.      Findings: No bruising, erythema, lesion or rash.   Neurological:      General: No focal deficit present.      Mental Status: She is alert and oriented to person, place, and time.      Cranial Nerves: No cranial nerve deficit.      Sensory: No sensory deficit.      Motor:  No weakness.      Coordination: Coordination normal.      Gait: Gait normal.      Deep Tendon Reflexes: Reflexes normal.   Psychiatric:         Mood and Affect: Mood normal.         Behavior: Behavior normal.         Thought Content: Thought content normal.          Judgment: Judgment normal.          Assessment/Plan    Diagnoses and all orders for this visit:    1. Gastroesophageal reflux disease with esophagitis without hemorrhage    2. Acquired hypothyroidism    3. Mixed hyperlipidemia    4. Obesity (BMI 30-39.9)    5. 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 Generalized anxiety disorder    Other orders  -     busPIRone (BUSPAR) 15 mg tablet; TAKE 1 TABLET BY MOUTH DAILY AS NEEDED (ANXIETY).         Health Maintenance Items reviewed with patient as noted.

## 2020-04-27 NOTE — Progress Notes (Signed)
Room:     Identified pt with two pt identifiers(name and DOB). Reviewed record in preparation for visit and have obtained necessary documentation. All patient medications has been reviewed.    Chief Complaint   Patient presents with   . Follow-up     3 month       Health Maintenance Due   Topic   . Hepatitis C Screening    . DTaP/Tdap/Td series (1 - Tdap)   . Cervical cancer screen    . COVID-19 Vaccine (3 - Booster for Moderna series)       Vitals:    04/27/20 1606   BP: 122/80   Pulse: 86   Resp: 17   Temp: 96.9 F (36.1 C)   SpO2: 99%   Weight: 218 lb (98.9 kg)   Height: 5\' 7"  (1.702 m)   PainSc:   0 - No pain   LMP: 04/22/2020       4.Have you been to the ER, urgent care clinic since your last visit?  Hospitalized since your last visit? No    5. Have you seen or consulted any other health care providers outside of the [facility] since your last visit?  Include any pap smears or colon screening. No      Patient is accompanied by self I have received verbal consent from Teresa Beasley to discuss any/all medical information while they are present in the room.

## 2020-05-04 MED ORDER — ATORVASTATIN 20 MG TAB
20 mg | ORAL_TABLET | ORAL | 0 refills | Status: DC
Start: 2020-05-04 — End: 2020-08-02

## 2020-05-04 MED ORDER — LEVOTHYROXINE 25 MCG TAB
25 mcg | ORAL_TABLET | ORAL | 0 refills | Status: DC
Start: 2020-05-04 — End: 2020-08-02

## 2020-07-20 ENCOUNTER — Encounter: Payer: PRIVATE HEALTH INSURANCE | Attending: Family Medicine | Primary: Family Medicine

## 2020-07-21 MED ORDER — LISINOPRIL-HYDROCHLOROTHIAZIDE 20 MG-12.5 MG TAB
ORAL_TABLET | ORAL | 0 refills | Status: DC
Start: 2020-07-21 — End: 2020-10-24

## 2020-07-30 MED ORDER — BUSPIRONE 15 MG TAB
15 mg | ORAL_TABLET | ORAL | 0 refills | Status: DC
Start: 2020-07-30 — End: 2020-11-03

## 2020-08-02 MED ORDER — ATORVASTATIN 20 MG TAB
20 mg | ORAL_TABLET | ORAL | 0 refills | Status: DC
Start: 2020-08-02 — End: 2020-11-03

## 2020-08-02 MED ORDER — LEVOTHYROXINE 25 MCG TAB
25 mcg | ORAL_TABLET | ORAL | 0 refills | Status: DC
Start: 2020-08-02 — End: 2020-11-03

## 2020-08-10 ENCOUNTER — Ambulatory Visit
Admit: 2020-08-10 | Discharge: 2020-08-10 | Payer: PRIVATE HEALTH INSURANCE | Attending: Family Medicine | Primary: Family Medicine

## 2020-08-10 ENCOUNTER — Ambulatory Visit: Attending: Family Medicine | Primary: Family Medicine

## 2020-08-10 DIAGNOSIS — I119 Hypertensive heart disease without heart failure: Secondary | ICD-10-CM

## 2020-08-10 NOTE — Progress Notes (Signed)
 1. Have you been to the ER, urgent care clinic since your last visit?  Hospitalized since your last visit? No    2. Have you seen or consulted any other health care providers outside of the [facility] since your last visit? No     3. For patients aged 40-75: Has the patient had a colonoscopy / FIT/ Cologuard? NA - based on age      If the patient is female:    4. For patients aged 33-74: Has the patient had a mammogram within the past 2 years? Yes      5. For patients aged 21-65: Has the patient had a pap smear? Yes - Care Gap present. Most recent result on file       Chief Complaint   Patient presents with   . Follow-up   . Hypertension   . Medication Refill       Visit Vitals  BP 126/84 (BP 1 Location: Left upper arm, BP Patient Position: Sitting, BP Cuff Size: Adult)   Pulse 81   Temp 98.5 F (36.9 C) (Oral)   Resp 18   Ht 5' 7 (1.702 m)   Wt 217 lb (98.4 kg)   LMP 08/05/2020   SpO2 98%   BMI 33.99 kg/m

## 2020-08-10 NOTE — Progress Notes (Signed)
Teresa Beasley is a 40 y.o. female and presents with Follow-up, Hypertension, and Medication Refill  .  HPI     Subjective:  Cardiovascular Review:  The patient has hypertension   Diet and Lifestyle: generally follows a low fat low cholesterol diet, generally follows a low sodium diet, exercises sporadically  Home BP Monitoring: is not measured at home.  Pertinent ROS: taking medications as instructed, no medication side effects noted, no TIA's, no chest pain on exertion, no dyspnea on exertion, no swelling of ankles.     Review of Systems  ROS      Past Medical History:   Diagnosis Date   ??? Acquired hypothyroidism 11/02/2018   ??? Bilateral ovarian cysts     Removal 03/24/2010   ??? Ear problems    ??? Essential hypertension 11/02/2018   ??? Gastro-esophageal reflux disease with esophagitis 11/02/2018   ??? Generalized anxiety disorder 11/02/2018   ??? GERD (gastroesophageal reflux disease)    ??? Hypercholesterolemia    ??? Hypernatremia 11/02/2018   ??? Obesity 11/02/2018   ??? Obesity (BMI 30-39.9) 11/02/2018   ??? Otalgia of right ear 11/02/2018   ??? URI (upper respiratory infection) 11/02/2018     History reviewed. No pertinent surgical history.  Social History     Socioeconomic History   ??? Marital status: MARRIED   Tobacco Use   ??? Smoking status: Never Smoker   ??? Smokeless tobacco: Never Used     Family History   Problem Relation Age of Onset   ??? Hypertension Mother    ??? Mult Sclerosis Father      Current Outpatient Medications   Medication Sig Dispense Refill   ??? atorvastatin (LIPITOR) 20 mg tablet TAKE 1 TABLET BY MOUTH EVERY DAY 90 Tablet 0   ??? levothyroxine (SYNTHROID) 25 mcg tablet TAKE 1 TABLET BY MOUTH EVERY DAY BEFORE BREAKFAST 90 Tablet 0   ??? busPIRone (BUSPAR) 15 mg tablet TAKE 1 TABLET BY MOUTH DAILY AS NEEDED FOR ANXIETY 90 Tablet 0   ??? lisinopril-hydroCHLOROthiazide (PRINZIDE, ZESTORETIC) 20-12.5 mg per tablet TAKE 1 TABLET BY MOUTH EVERY DAY 90 Tablet 0   ??? omeprazole (PRILOSEC) 40 mg capsule Take 40 mg by mouth daily.     ???  ethinyl estradiol-etonogestrel (NuvaRing) 0.12-0.015 mg/24 hr vaginal ring by Intravaginal route. (Patient not taking: Reported on 04/27/2020)       No Known Allergies    Objective:  Visit Vitals  BP 126/84 (BP 1 Location: Left upper arm, BP Patient Position: Sitting, BP Cuff Size: Adult)   Pulse 81   Temp 98.5 ??F (36.9 ??C) (Oral)   Resp 18   Ht '5\' 7"'  (1.702 m)   Wt 217 lb (98.4 kg)   LMP 08/05/2020   SpO2 98%   BMI 33.99 kg/m??       Physical Exam:   Physical Exam        Results for orders placed or performed in visit on 09/12/19   LIPID PANEL   Result Value Ref Range    Cholesterol, total 147 100 - 199 mg/dL    Triglyceride 68 0 - 149 mg/dL    HDL Cholesterol 49 >39 mg/dL    VLDL, calculated 14 5 - 40 mg/dL    LDL, calculated 84 0 - 99 mg/dL   METABOLIC PANEL, COMPREHENSIVE   Result Value Ref Range    Glucose 98 65 - 99 mg/dL    BUN 10 6 - 20 mg/dL    Creatinine 0.71 0.57 - 1.00 mg/dL  GFR est non-AA 108 >59 mL/min/1.73    GFR est AA 125 >59 mL/min/1.73    BUN/Creatinine ratio 14 9 - 23    Sodium 142 134 - 144 mmol/L    Potassium 4.5 3.5 - 5.2 mmol/L    Chloride 106 96 - 106 mmol/L    CO2 22 20 - 29 mmol/L    Calcium 9.1 8.7 - 10.2 mg/dL    Protein, total 6.7 6.0 - 8.5 g/dL    Albumin 4.2 3.8 - 4.8 g/dL    GLOBULIN, TOTAL 2.5 1.5 - 4.5 g/dL    A-G Ratio 1.7 1.2 - 2.2    Bilirubin, total 0.3 0.0 - 1.2 mg/dL    Alk. phosphatase 72 44 - 121 IU/L    AST (SGOT) 14 0 - 40 IU/L    ALT (SGPT) 11 0 - 32 IU/L   TSH 3RD GENERATION   Result Value Ref Range    TSH 2.080 0.450 - 4.500 uIU/mL   T4 (THYROXINE)   Result Value Ref Range    T4, Total 11.2 4.5 - 12.0 ug/dL       Assessment/Plan:    ICD-10-CM ICD-9-CM    1. 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 Gastroesophageal reflux disease with esophagitis without hemorrhage  K21.00 530.81      530.10    3. Acquired hypothyroidism  L38.1 017.5 METABOLIC PANEL, BASIC      T4, FREE      TSH 3RD GENERATION   4. Generalized  anxiety disorder  F41.1 300.02    5. Mixed hyperlipidemia  E78.2 272.2      No orders of the defined types were placed in this encounter.    Cannot display discharge medications since this is not an admission.

## 2020-10-24 MED ORDER — LISINOPRIL-HYDROCHLOROTHIAZIDE 20 MG-12.5 MG TAB
ORAL_TABLET | ORAL | 0 refills | Status: DC
Start: 2020-10-24 — End: 2021-01-15

## 2020-11-03 MED ORDER — LEVOTHYROXINE 25 MCG TAB
25 mcg | ORAL_TABLET | ORAL | 0 refills | Status: DC
Start: 2020-11-03 — End: 2021-02-05

## 2020-11-03 MED ORDER — BUSPIRONE 15 MG TAB
15 mg | ORAL_TABLET | ORAL | 2 refills | Status: DC
Start: 2020-11-03 — End: 2020-11-28

## 2020-11-03 MED ORDER — ATORVASTATIN 20 MG TAB
20 mg | ORAL_TABLET | ORAL | 0 refills | Status: DC
Start: 2020-11-03 — End: 2021-01-31

## 2020-11-14 ENCOUNTER — Ambulatory Visit: Payer: PRIVATE HEALTH INSURANCE | Attending: Surgery | Primary: Family Medicine

## 2020-11-16 ENCOUNTER — Ambulatory Visit
Admit: 2020-11-16 | Discharge: 2020-11-16 | Payer: PRIVATE HEALTH INSURANCE | Attending: Surgery | Primary: Family Medicine

## 2020-11-16 ENCOUNTER — Ambulatory Visit: Payer: PRIVATE HEALTH INSURANCE | Attending: Surgery | Primary: Family Medicine

## 2020-11-16 ENCOUNTER — Ambulatory Visit: Attending: Surgery | Primary: Family Medicine

## 2020-11-16 DIAGNOSIS — K8020 Calculus of gallbladder without cholecystitis without obstruction: Secondary | ICD-10-CM

## 2020-11-16 NOTE — Progress Notes (Signed)
General Surgery Office Consultation / H & P    CC: Abdominal pain  History of Present Illness:      Teresa Beasley is a 40 y.o. female who presents with abdominal pain.    She reports that for the last few months she has had persistent right lower quadrant and right upper quadrant pain.  She is a history of endometriosis and ovarian cysts.  She has had her left ovary removed prior.  She saw her gynecologist and she has a very small 2 cm right ovarian cyst and possibly endometriosis.  She is now on hormone therapy to help with this.  Reports that her pain in her upper quadrant happens frequently at least a few times a week.  The pain is in the right upper quadrant under her rib cage.  It is worse with food.  Worse with fatty food.  Time and over-the-counter pain medication helps.  No significant radiation of pain.  She had a gallbladder bladder ultrasound done showing gallstones.    Past Medical History:   Diagnosis Date    Acquired hypothyroidism 11/02/2018    Bilateral ovarian cysts     Removal 03/24/2010    Ear problems     Essential hypertension 11/02/2018    Gastro-esophageal reflux disease with esophagitis 11/02/2018    Generalized anxiety disorder 11/02/2018    GERD (gastroesophageal reflux disease)     Hypercholesterolemia     Hypernatremia 11/02/2018    Obesity 11/02/2018    Obesity (BMI 30-39.9) 11/02/2018    Otalgia of right ear 11/02/2018    URI (upper respiratory infection) 11/02/2018     Past Surgical History:   Procedure Laterality Date    HX GYN      HX OVARIAN CYST REMOVAL  2007    Dr. Daphine Deutscher      Family History   Problem Relation Age of Onset    Hypertension Mother     Mult Sclerosis Father      Social History     Socioeconomic History    Marital status: MARRIED   Tobacco Use    Smoking status: Never    Smokeless tobacco: Never      Prior to Admission medications    Medication Sig Start Date End Date Taking? Authorizing Provider   levothyroxine (SYNTHROID) 25 mcg tablet TAKE 1 TABLET BY MOUTH EVERY DAY  BEFORE BREAKFAST 11/03/20  Yes Lawrence Santiago, MD   atorvastatin (LIPITOR) 20 mg tablet TAKE 1 TABLET BY MOUTH EVERY DAY 11/03/20  Yes Lawrence Santiago, MD   busPIRone (BUSPAR) 15 mg tablet TAKE 1 TABLET BY MOUTH EVERY DAY AS NEEDED FOR ANXIETY 11/03/20  Yes Lawrence Santiago, MD   lisinopril-hydroCHLOROthiazide (PRINZIDE, ZESTORETIC) 20-12.5 mg per tablet TAKE 1 TABLET BY MOUTH EVERY DAY 10/24/20  Yes Lawrence Santiago, MD   omeprazole (PRILOSEC) 40 mg capsule Take 40 mg by mouth daily.   Yes Provider, Historical   ethinyl estradiol-etonogestrel (NUVARING) 0.12-0.015 mg/24 hr vaginal ring by Intravaginal route.  Patient not taking: No sig reported    Provider, Historical     No Known Allergies    Review of Systems:  Constitutional: No fever or chills  Neurologic: No headache  Eyes: No scleral icterus or irritated eyes  Nose: No nasal pain or drainage  Mouth: No oral lesions or sore throat  Cardiac: No palpations or chest pain  Pulmonary: No cough or shortness of breath  Gastrointestinal: Upper quadrant pain and right lower quadrant pain, no nausea, emesis, diarrhea, or constipation  Genitourinary: No dysuria  Musculoskeletal: No muscle or joint tenderness  Skin: No rashes or lesions  Psychiatric: No anxiety or depressed mood    Physical Exam:   Visit Vitals  BP 138/84 (BP 1 Location: Left upper arm, BP Patient Position: Sitting, BP Cuff Size: Large adult)   Pulse 99   Temp 98.5 ??F (36.9 ??C) (Oral)   Resp 14   Ht 5\' 7"  (1.702 m)   Wt 218 lb 12.8 oz (99.2 kg)   SpO2 98%   BMI 34.27 kg/m??     General: No acute distress, conversant  Eyes: PERRLA, no scleral icterus  HENT: Normocephalic without oral lesions  Neck: Trachea midline without LAD  Cardiac: Normal pulse rate and rhythm  Pulmonary: Symmetric chest rise with normal effort  GI: Soft, mild tenderness upper quadrant, ND, no hernia, no splenomegaly  Skin: Warm without rash  Extremities: No edema or joint stiffness  Psych: Appropriate mood and affect    Assessment:      40 year old female with possibly endometriosis along with symptomatic cholelithiasis  Plan:     The patient and gynecologist want to have her gallbladder removed while trying hormone therapy for her endometriosis.  I will also document any findings in the OR.  I will have her do labs today when leaving to evaluate for the need for IOC.  We discussed robotic cholecystectomy.  We discussed the risk of bleeding, infection, bowel injury, liver injury, cystic duct leak, CBD stone, postoperative diarrhea, and the risk of anesthesia.  We will contact her to schedule surgery at her convenience.  I will follow-up the labs to ensure we do not need to do an IOC.  Ultrasound report to be scanned into the computer.    Signed By: 24, MD  Bariatric and General Surgeon  [facility]    November 16, 2020

## 2020-11-16 NOTE — Progress Notes (Signed)
1. Have you been to the ER, urgent care clinic since your last visit?  Hospitalized since your last visit? Yes, Teresa Beasley     2. Have you seen or consulted any other health care providers outside of the [facility] since your last visit?  Include any pap smears or colon screening. No

## 2020-11-17 LAB — CBC
Hematocrit: 31.3 % — ABNORMAL LOW (ref 34.0–46.6)
Hemoglobin: 9.6 g/dL — ABNORMAL LOW (ref 11.1–15.9)
MCH: 24 pg — ABNORMAL LOW (ref 26.6–33.0)
MCHC: 30.7 g/dL — ABNORMAL LOW (ref 31.5–35.7)
MCV: 78 fL — ABNORMAL LOW (ref 79–97)
Platelets: 304 10*3/uL (ref 150–450)
RBC: 4 x10E6/uL (ref 3.77–5.28)
RDW: 15.1 % (ref 11.7–15.4)
WBC: 10.8 10*3/uL (ref 3.4–10.8)

## 2020-11-17 LAB — COMPREHENSIVE METABOLIC PANEL
ALT: 7 IU/L (ref 0–32)
AST: 12 IU/L (ref 0–40)
Albumin/Globulin Ratio: 1.9 NA (ref 1.2–2.2)
Albumin: 4.8 g/dL (ref 3.8–4.8)
Alkaline Phosphatase: 78 IU/L (ref 44–121)
BUN: 12 mg/dL (ref 6–20)
Bun/Cre Ratio: 20 NA (ref 9–23)
CO2: 22 mmol/L (ref 20–29)
Calcium: 9.1 mg/dL (ref 8.7–10.2)
Chloride: 103 mmol/L (ref 96–106)
Creatinine: 0.61 mg/dL (ref 0.57–1.00)
Est, Glomerular Filtration Rate: 117 mL/min/{1.73_m2} (ref >59)
Globulin, Total: 2.5 g/dL (ref 1.5–4.5)
Glucose: 90 mg/dL (ref 65–99)
Potassium: 4.5 mmol/L (ref 3.5–5.2)
Sodium: 140 mmol/L (ref 134–144)
Total Bilirubin: <0.2 mg/dL (ref 0.0–1.2)
Total Protein: 7.3 g/dL (ref 6.0–8.5)

## 2020-11-17 LAB — METABOLIC PANEL, COMPREHENSIVE
A-G Ratio: 1.9 (ref 1.2–2.2)
ALT (SGPT): 7 IU/L (ref 0–32)
AST (SGOT): 12 IU/L (ref 0–40)
Albumin: 4.8 g/dL (ref 3.8–4.8)
Alk. phosphatase: 78 IU/L (ref 44–121)
BUN/Creatinine ratio: 20 (ref 9–23)
BUN: 12 mg/dL (ref 6–20)
Bilirubin, total: <0.2 mg/dL (ref 0.0–1.2)
CO2: 22 mmol/L (ref 20–29)
Calcium: 9.1 mg/dL (ref 8.7–10.2)
Chloride: 103 mmol/L (ref 96–106)
Creatinine: 0.61 mg/dL (ref 0.57–1.00)
GLOBULIN, TOTAL: 2.5 g/dL (ref 1.5–4.5)
Glucose: 90 mg/dL (ref 65–99)
Potassium: 4.5 mmol/L (ref 3.5–5.2)
Protein, total: 7.3 g/dL (ref 6.0–8.5)
Sodium: 140 mmol/L (ref 134–144)
eGFR: 117 mL/min/{1.73_m2} (ref >59)

## 2020-11-17 LAB — CBC W/O DIFF
HCT: 31.3 % — ABNORMAL LOW (ref 34.0–46.6)
HGB: 9.6 g/dL — ABNORMAL LOW (ref 11.1–15.9)
MCH: 24 pg — ABNORMAL LOW (ref 26.6–33.0)
MCHC: 30.7 g/dL — ABNORMAL LOW (ref 31.5–35.7)
MCV: 78 fL — ABNORMAL LOW (ref 79–97)
PLATELET: 304 10*3/uL (ref 150–450)
RBC: 4 x10E6/uL (ref 3.77–5.28)
RDW: 15.1 % (ref 11.7–15.4)
WBC: 10.8 10*3/uL (ref 3.4–10.8)

## 2020-11-18 LAB — BASIC METABOLIC PANEL
BUN: 14 mg/dL (ref 6–20)
Bun/Cre Ratio: 18 NA (ref 9–23)
CO2: 21 mmol/L (ref 20–29)
Calcium: 9.1 mg/dL (ref 8.7–10.2)
Chloride: 104 mmol/L (ref 96–106)
Creatinine: 0.77 mg/dL (ref 0.57–1.00)
Est, Glomerular Filtration Rate: 101 mL/min/{1.73_m2} (ref >59)
Glucose: 100 mg/dL — ABNORMAL HIGH (ref 65–99)
Potassium: 4 mmol/L (ref 3.5–5.2)
Sodium: 139 mmol/L (ref 134–144)

## 2020-11-18 LAB — TSH 3RD GENERATION
TSH: 3.45 u[IU]/mL (ref 0.450–4.500)
TSH: 3.45 u[IU]/mL (ref 0.450–4.500)

## 2020-11-18 LAB — T4, FREE
T4 Free: 1.26 ng/dL (ref 0.82–1.77)
T4, Free: 1.26 ng/dL (ref 0.82–1.77)

## 2020-11-18 LAB — METABOLIC PANEL, BASIC
BUN/Creatinine ratio: 18 (ref 9–23)
BUN: 14 mg/dL (ref 6–20)
CO2: 21 mmol/L (ref 20–29)
Calcium: 9.1 mg/dL (ref 8.7–10.2)
Chloride: 104 mmol/L (ref 96–106)
Creatinine: 0.77 mg/dL (ref 0.57–1.00)
Glucose: 100 mg/dL — ABNORMAL HIGH (ref 65–99)
Potassium: 4 mmol/L (ref 3.5–5.2)
Sodium: 139 mmol/L (ref 134–144)
eGFR: 101 mL/min/{1.73_m2} (ref >59)

## 2020-11-19 NOTE — Telephone Encounter (Signed)
I called and left a message as requested by the patient earlier. You should be able to go swimming in a pool two weeks after surgery. You will be assessed at the time of your two week post-op visit.

## 2020-11-19 NOTE — Telephone Encounter (Signed)
I called and spoke with the patient. She wanted to know how soon after surgery can she go swimming? I told her I will speak with the doctor and call you back. She acknowledged understanding and thanked me for the call.

## 2020-11-19 NOTE — Telephone Encounter (Signed)
Please call to discuss post op recovery details.

## 2020-11-21 ENCOUNTER — Ambulatory Visit

## 2020-11-21 NOTE — Interval H&P Note (Signed)
PATIENT INSTRUCTED TO BRING COVID-19 VACCINE CARD DOS.

## 2020-11-21 NOTE — Interval H&P Note (Signed)
[facility]  PREOPERATIVE INSTRUCTIONS    Surgery Date:   11/27/2020    Your surgeon's office or [facility] staff will call you between 4 PM- 8 PM the day before surgery with your arrival time. If your surgery is on a Monday, you will receive a call the preceding Friday.     Please report to [facility] Patient Access/Admitting on the 1st floor.  Bring your insurance card, photo identification, and any copayment ( if applicable).   If you are going home the same day of your surgery, you must have a responsible adult to drive you home. You need to have a responsible adult to stay with you the first 24 hours after surgery and you should not drive a car for 24 hours following your surgery.  Nothing to eat or drink after midnight the night before surgery. This includes no water, gum, mints, coffee, juice, etc.  Please note special instructions, if applicable, below for medications.  Do NOT drink alcohol or smoke 24 hours before surgery. STOP smoking for 14 days prior as it helps with breathing and healing after surgery.  If you are being admitted to the hospital, please leave personal belongings/luggage in your car until you have an assigned hospital room number.  Please wear comfortable clothes. Wear your glasses instead of contacts. We ask that all money, jewelry and valuables be left at home. Wear no make up, particularly mascara, the day of surgery.    All body piercings, rings, and jewelry need to be removed and left at home. Please remove any nail polish or artificial nails from your fingernails. Please wear your hair loose or down. Please no pony-tails, buns, or any metal hair accessories. If you shower the morning of surgery, please do not apply any lotions or powders afterwards.  You may wear deodorant, unless having breast surgery. Do not shave any body area within 24 hours of your surgery.  Please follow all instructions to avoid any potential surgical cancellation.  Should your physical condition  change, (i.e. fever, cold, flu, etc.) please notify your surgeon as soon as possible.  It is important to be on time. If a situation occurs where you may be delayed, please call:  [phone] / [phone] on the day of surgery.  The Preadmission Testing staff can be reached at [phone].  Special instructions: N/A      No current facility-administered medications for this encounter.     Current Outpatient Medications   Medication Sig    levothyroxine (SYNTHROID) 25 mcg tablet TAKE 1 TABLET BY MOUTH EVERY DAY BEFORE BREAKFAST (Patient taking differently: Take 25 mcg by mouth nightly.)    atorvastatin (LIPITOR) 20 mg tablet TAKE 1 TABLET BY MOUTH EVERY DAY (Patient taking differently: Take 20 mg by mouth every morning.)    busPIRone (BUSPAR) 15 mg tablet TAKE 1 TABLET BY MOUTH EVERY DAY AS NEEDED FOR ANXIETY (Patient taking differently: Take 15 mg by mouth nightly. TAKE 1 TABLET BY MOUTH EVERY DAY AS NEEDED FOR ANXIETY)    lisinopril-hydroCHLOROthiazide (PRINZIDE, ZESTORETIC) 20-12.5 mg per tablet TAKE 1 TABLET BY MOUTH EVERY DAY (Patient taking differently: Take 1 Tablet by mouth every morning.)    ethinyl estradiol-etonogestrel (NUVARING) 0.12-0.015 mg/24 hr vaginal ring by Intravaginal route.    omeprazole (PRILOSEC) 40 mg capsule Take 40 mg by mouth every morning.       YOU MUST ONLY TAKE THESE MEDICATIONS THE MORNING OF SURGERY WITH A SIP OF WATER: ATORVASTATIN, LEVOTHYROXINE, OMEPRAZOLE  MEDICATIONS TO TAKE THE MORNING OF SURGERY ONLY IF NEEDED: N/A  HOLD these prescription medications BEFORE Surgery: LISINOPRIL-HCTZ  Ask your surgeon/prescribing physician about when/if to STOP taking these medications: NUVARING  Stop all vitamins, herbal medicines and Aspirin containing products 7 days prior to surgery. Stop any non-steroidal anti-inflammatory drugs (i.e. Ibuprofen, Naproxen, Advil, Aleve) 3 days before surgery. You may take Tylenol.    If you are currently taking Plavix, Coumadin, or any other  blood-thinning/anticoagulant medication contact your prescribing physician for instructions.    Preventing Infections Before and After - Your Surgery    IMPORTANT INSTRUCTIONS      You play an important role in your health and preparation for surgery. To reduce the germs on your skin you will need to shower with CHG soap (Chorhexidine gluconate 4%) two times before surgery.    CHG soap (Hibiclens, Hex-A-Clens or store brand)  CHG soap will be provided at your Preadmission Testing (PAT) appointment.  If you do not have a PAT appointment before surgery, you may arrange to pick up CHG soap from our office or purchase CHG soap at a pharmacy, grocery or department store.  You need to purchase TWO 4 ounce bottles to use for your 2 showers.    Steps to follow:  Wash your hair with your normal shampoo and your body with regular soap and rinse well to remove shampoo and soap from your skin.  Wet a clean washcloth and turn off the shower.  Put CHG soap on washcloth and apply to your entire body from the neck down. Do not use on your head, face or private parts(genitals). Do not use CHG soap on open sores, wounds or areas of skin irritation.  Wash you body gently for 5 minutes. Do not wash your skin too hard. This soap does not create lather. Pay special attention to your underarms and from your belly button to your feet.  Turn the shower back on and rinse well to get CHG soap off your body.  Pat your skin dry with a clean, dry towel. Do not apply lotions or moisturizer.  Put on clean clothes and sleep on fresh bed sheets and do not allow pets to sleep with you.    Shower with CHG soap 2 times before your surgery  The evening before your surgery  The morning of your surgery      Tips to help prevent infections after your surgery:  Protect your surgical wound from germs:  Hand washing is the most important thing you and your caregivers can do to prevent infections.  Keep your bandage clean and dry!  Do not touch your surgical  wound.  Use clean, freshly washed towels and washcloths every time you shower; do not share bath linens with others.  Until your surgical wound is healed, wear clothing and sleep on bed linens each day that are clean and freshly washed.  Do not allow pets to sleep in your bed with you or touch your surgical wound.  Do not smoke - smoking delays wound healing. This may be a good time to stop smoking.  If you have diabetes, it is important for you to manage your blood sugar levels properly before your surgery as well as after your surgery. Poorly managed blood sugar levels slow down wound healing and prevent you from healing completely.          Patient Information Regarding COVID Restrictions    Day of Procedure    Please park in the  parking deck or any designated visitor parking lot.  Enter the facility through the Main Entrance of the hospital.  On the day of surgery, please provide the cell phone number of the person who will be waiting for you to the Patient Access representative at the time of registration.  Please wear a mask on the day of your procedure.  We are now allowing two designated visitors per stay. Pediatric patients may have 2 designated visitors. These two people may come in with you on the day of your procedure.  The designated visitor must also wear a mask.  Once your procedure and the immediate recovery period is completed, a nurse in the recovery area will contact your designated visitor to inform them of your room number or to otherwise review other pertinent information regarding your care.    Social distancing practices are to be adhered to in waiting areas and the cafeteria.       The patient was contacted  via phone.   She verbalized understanding of all instructions does not  need reinforcement.

## 2020-11-23 ENCOUNTER — Encounter: Attending: Family Medicine | Primary: Family Medicine

## 2020-11-27 ENCOUNTER — Inpatient Hospital Stay: Payer: PRIVATE HEALTH INSURANCE

## 2020-11-27 LAB — HCG URINE, QL. - POC
HCG, Pregnancy, Urine, POC: NEGATIVE
Pregnancy test,urine (POC): NEGATIVE

## 2020-11-27 MED ORDER — PROPOFOL 10 MG/ML IV EMUL
10 mg/mL | INTRAVENOUS | Status: AC
Start: 2020-11-27 — End: ?

## 2020-11-27 MED ORDER — INDOCYANINE GREEN 25 MG INJECTION
25 mg | INTRAMUSCULAR | Status: AC
Start: 2020-11-27 — End: ?

## 2020-11-27 MED ORDER — FENTANYL CITRATE (PF) 50 MCG/ML IJ SOLN
50 mcg/mL | INTRAMUSCULAR | Status: DC | PRN
Start: 2020-11-27 — End: 2020-11-27
  Administered 2020-11-27 (×2): via INTRAVENOUS

## 2020-11-27 MED ORDER — MEPERIDINE (PF) 25 MG/ML INJ SOLUTION
25 mg/ml | Freq: Once | INTRAMUSCULAR | Status: DC
Start: 2020-11-27 — End: 2020-11-27

## 2020-11-27 MED ORDER — LIDOCAINE (PF) 20 MG/ML (2 %) IJ SOLN
20 mg/mL (2 %) | INTRAMUSCULAR | Status: AC
Start: 2020-11-27 — End: ?

## 2020-11-27 MED ORDER — OXYCODONE 5 MG TAB
5 mg | ORAL | Status: DC | PRN
Start: 2020-11-27 — End: 2020-11-27

## 2020-11-27 MED ORDER — SODIUM CHLORIDE 0.9 % IJ SYRG
INTRAMUSCULAR | Status: DC | PRN
Start: 2020-11-27 — End: 2020-11-27

## 2020-11-27 MED ORDER — MIDAZOLAM 1 MG/ML IJ SOLN
1 mg/mL | INTRAMUSCULAR | Status: DC | PRN
Start: 2020-11-27 — End: 2020-11-27

## 2020-11-27 MED ORDER — LACTATED RINGERS IV
INTRAVENOUS | Status: DC | PRN
Start: 2020-11-27 — End: 2020-11-27
  Administered 2020-11-27: 17:00:00 via INTRAVENOUS

## 2020-11-27 MED ORDER — INDOCYANINE GREEN 25 MG INJECTION
25 mg | INTRAMUSCULAR | Status: AC
Start: 2020-11-27 — End: 2020-11-27
  Administered 2020-11-27: 16:00:00 via INTRAVENOUS

## 2020-11-27 MED ORDER — CEFAZOLIN 1 GRAM SOLUTION FOR INJECTION
1 gram | Freq: Once | INTRAMUSCULAR | Status: AC
Start: 2020-11-27 — End: 2020-11-27
  Administered 2020-11-27: 17:00:00 via INTRAVENOUS

## 2020-11-27 MED ORDER — DEXMEDETOMIDINE 100 MCG/ML IV SOLN
100 mcg/mL | INTRAVENOUS | Status: DC | PRN
Start: 2020-11-27 — End: 2020-11-27
  Administered 2020-11-27 (×2): via INTRAVENOUS

## 2020-11-27 MED ORDER — HYDROMORPHONE 1 MG/ML INJECTION SOLUTION
1 mg/mL | INTRAMUSCULAR | Status: DC | PRN
Start: 2020-11-27 — End: 2020-11-27

## 2020-11-27 MED ORDER — SODIUM CHLORIDE 0.9 % IJ SYRG
Freq: Three times a day (TID) | INTRAMUSCULAR | Status: DC
Start: 2020-11-27 — End: 2020-11-27

## 2020-11-27 MED ORDER — ONDANSETRON (PF) 4 MG/2 ML INJECTION
4 mg/2 mL | INTRAMUSCULAR | Status: AC
Start: 2020-11-27 — End: ?

## 2020-11-27 MED ORDER — FENTANYL CITRATE (PF) 50 MCG/ML IJ SOLN
50 mcg/mL | INTRAMUSCULAR | Status: DC | PRN
Start: 2020-11-27 — End: 2020-11-27
  Administered 2020-11-27: 18:00:00 via INTRAVENOUS

## 2020-11-27 MED ORDER — GLYCOPYRROLATE 0.2 MG/ML IJ SOLN
0.2 mg/mL | INTRAMUSCULAR | Status: DC | PRN
Start: 2020-11-27 — End: 2020-11-27
  Administered 2020-11-27: 18:00:00 via INTRAVENOUS

## 2020-11-27 MED ORDER — LIDOCAINE (PF) 20 MG/ML (2 %) IJ SOLN
20 mg/mL (2 %) | INTRAMUSCULAR | Status: DC | PRN
Start: 2020-11-27 — End: 2020-11-27
  Administered 2020-11-27: 17:00:00 via INTRAVENOUS

## 2020-11-27 MED ORDER — ROCURONIUM 10 MG/ML IV
10 mg/mL | INTRAVENOUS | Status: DC | PRN
Start: 2020-11-27 — End: 2020-11-27
  Administered 2020-11-27: 17:00:00 via INTRAVENOUS

## 2020-11-27 MED ORDER — BUPIVACAINE-EPINEPHRINE (PF) 0.25 %-1:200,000 IJ SOLN
0.25 %-1:200,000 | INTRAMUSCULAR | Status: AC
Start: 2020-11-27 — End: ?

## 2020-11-27 MED ORDER — LACTATED RINGERS IV
INTRAVENOUS | Status: DC
Start: 2020-11-27 — End: 2020-11-27

## 2020-11-27 MED ORDER — ROPIVACAINE (PF) 5 MG/ML (0.5 %) INJECTION
5 mg/mL (0. %) | INTRAMUSCULAR | Status: DC | PRN
Start: 2020-11-27 — End: 2020-11-27

## 2020-11-27 MED ORDER — BUPIVACAINE-EPINEPHRINE (PF) 0.25 %-1:200,000 IJ SOLN
0.25 %-1:200,000 | INTRAMUSCULAR | Status: DC | PRN
Start: 2020-11-27 — End: 2020-11-27
  Administered 2020-11-27: 18:00:00 via SUBCUTANEOUS

## 2020-11-27 MED ORDER — MIDAZOLAM 1 MG/ML IJ SOLN
1 mg/mL | INTRAMUSCULAR | Status: DC | PRN
Start: 2020-11-27 — End: 2020-11-27
  Administered 2020-11-27: 17:00:00 via INTRAVENOUS

## 2020-11-27 MED ORDER — MORPHINE 2 MG/ML INJECTION
2 mg/mL | INTRAMUSCULAR | Status: DC | PRN
Start: 2020-11-27 — End: 2020-11-27

## 2020-11-27 MED ORDER — LACTATED RINGERS IV
INTRAVENOUS | Status: DC
Start: 2020-11-27 — End: 2020-11-27
  Administered 2020-11-27: 16:00:00 via INTRAVENOUS

## 2020-11-27 MED ORDER — OXYCODONE-ACETAMINOPHEN 5 MG-325 MG TAB
5-325 mg | ORAL_TABLET | ORAL | 0 refills | Status: AC | PRN
Start: 2020-11-27 — End: 2020-11-30

## 2020-11-27 MED ORDER — FENTANYL CITRATE (PF) 50 MCG/ML IJ SOLN
50 mcg/mL | INTRAMUSCULAR | Status: AC
Start: 2020-11-27 — End: ?

## 2020-11-27 MED ORDER — ROCURONIUM 10 MG/ML IV
10 mg/mL | INTRAVENOUS | Status: AC
Start: 2020-11-27 — End: ?

## 2020-11-27 MED ORDER — DEXAMETHASONE SODIUM PHOSPHATE 4 MG/ML IJ SOLN
4 mg/mL | INTRAMUSCULAR | Status: AC
Start: 2020-11-27 — End: ?

## 2020-11-27 MED ORDER — SUGAMMADEX 100 MG/ML INTRAVENOUS SOLUTION
100 mg/mL | INTRAVENOUS | Status: AC
Start: 2020-11-27 — End: ?

## 2020-11-27 MED ORDER — MIDAZOLAM 1 MG/ML IJ SOLN
1 mg/mL | INTRAMUSCULAR | Status: AC
Start: 2020-11-27 — End: ?

## 2020-11-27 MED ORDER — HYDROMORPHONE (PF) 2 MG/ML IJ SOLN
2 mg/mL | INTRAMUSCULAR | Status: DC | PRN
Start: 2020-11-27 — End: 2020-11-27
  Administered 2020-11-27 (×4): via INTRAVENOUS

## 2020-11-27 MED ORDER — ONDANSETRON (PF) 4 MG/2 ML INJECTION
4 mg/2 mL | INTRAMUSCULAR | Status: DC | PRN
Start: 2020-11-27 — End: 2020-11-27
  Administered 2020-11-27: 18:00:00 via INTRAVENOUS

## 2020-11-27 MED ORDER — HYDROMORPHONE 2 MG/ML INJECTION SOLUTION
2 mg/mL | INTRAMUSCULAR | Status: AC
Start: 2020-11-27 — End: ?

## 2020-11-27 MED ORDER — POLYETHYLENE GLYCOL 3350 17 GRAM (100 %) ORAL POWDER PACKET
17 gram | PACK | Freq: Every day | ORAL | 1 refills | Status: AC
Start: 2020-11-27 — End: 2021-05-17

## 2020-11-27 MED ORDER — LIDOCAINE (PF) 10 MG/ML (1 %) IJ SOLN
10 mg/mL (1 %) | INTRAMUSCULAR | Status: DC | PRN
Start: 2020-11-27 — End: 2020-11-27
  Administered 2020-11-27: 16:00:00 via SUBCUTANEOUS

## 2020-11-27 MED ORDER — NEOSTIGMINE METHYLSULFATE 3 MG/3 ML (1 MG/ML) IV SYRINGE
3 mg/ mL (1 mg/mL) | INTRAVENOUS | Status: DC | PRN
Start: 2020-11-27 — End: 2020-11-27
  Administered 2020-11-27: 18:00:00 via INTRAVENOUS

## 2020-11-27 MED ORDER — FENTANYL CITRATE (PF) 50 MCG/ML IJ SOLN
50 mcg/mL | INTRAMUSCULAR | Status: DC | PRN
Start: 2020-11-27 — End: 2020-11-27

## 2020-11-27 MED ORDER — DEXAMETHASONE SODIUM PHOSPHATE 4 MG/ML IJ SOLN
4 mg/mL | INTRAMUSCULAR | Status: DC | PRN
Start: 2020-11-27 — End: 2020-11-27
  Administered 2020-11-27: 17:00:00 via INTRAVENOUS

## 2020-11-27 MED ORDER — ACETAMINOPHEN 325 MG TABLET
325 mg | Freq: Once | ORAL | Status: AC
Start: 2020-11-27 — End: 2020-11-27
  Administered 2020-11-27: 16:00:00 via ORAL

## 2020-11-27 MED ORDER — PHENYLEPHRINE 10 MG/ML INJECTION
10 mg/mL | INTRAMUSCULAR | Status: AC
Start: 2020-11-27 — End: ?

## 2020-11-27 MED ORDER — DIPHENHYDRAMINE HCL 50 MG/ML IJ SOLN
50 mg/mL | INTRAMUSCULAR | Status: DC | PRN
Start: 2020-11-27 — End: 2020-11-27

## 2020-11-27 MED ORDER — PROPOFOL 10 MG/ML IV EMUL
10 mg/mL | INTRAVENOUS | Status: DC | PRN
Start: 2020-11-27 — End: 2020-11-27
  Administered 2020-11-27 (×3): via INTRAVENOUS

## 2020-11-27 MED ORDER — SODIUM CHLORIDE 0.9 % IV
INTRAVENOUS | Status: DC
Start: 2020-11-27 — End: 2020-11-27

## 2020-11-27 MED ORDER — KETOROLAC TROMETHAMINE 30 MG/ML INJECTION
30 mg/mL (1 mL) | INTRAMUSCULAR | Status: DC | PRN
Start: 2020-11-27 — End: 2020-11-27
  Administered 2020-11-27: 18:00:00 via INTRAVENOUS

## 2020-11-27 MED FILL — HYDROMORPHONE 2 MG/ML INJECTION SOLUTION: 2 mg/mL | INTRAMUSCULAR | Qty: 1

## 2020-11-27 MED FILL — LACTATED RINGERS IV: INTRAVENOUS | Qty: 1000

## 2020-11-27 MED FILL — FENTANYL CITRATE (PF) 50 MCG/ML IJ SOLN: 50 mcg/mL | INTRAMUSCULAR | Qty: 2

## 2020-11-27 MED FILL — INDOCYANINE GREEN 25 MG INJECTION: 25 mg | INTRAMUSCULAR | Qty: 25

## 2020-11-27 MED FILL — LIDOCAINE (PF) 20 MG/ML (2 %) IJ SOLN: 20 mg/mL (2 %) | INTRAMUSCULAR | Qty: 5

## 2020-11-27 MED FILL — DIPRIVAN 10 MG/ML INTRAVENOUS EMULSION: 10 mg/mL | INTRAVENOUS | Qty: 20

## 2020-11-27 MED FILL — DEXAMETHASONE SODIUM PHOSPHATE 4 MG/ML IJ SOLN: 4 mg/mL | INTRAMUSCULAR | Qty: 2

## 2020-11-27 MED FILL — BRIDION 100 MG/ML INTRAVENOUS SOLUTION: 100 mg/mL | INTRAVENOUS | Qty: 2

## 2020-11-27 MED FILL — ONDANSETRON (PF) 4 MG/2 ML INJECTION: 4 mg/2 mL | INTRAMUSCULAR | Qty: 2

## 2020-11-27 MED FILL — PHENYLEPHRINE 10 MG/ML INJECTION: 10 mg/mL | INTRAMUSCULAR | Qty: 1

## 2020-11-27 MED FILL — SENSORCAINE-MPF/EPINEPHRINE 0.25 %-1:200,000 INJECTION SOLUTION: 0.25 %-1:200,000 | INTRAMUSCULAR | Qty: 30

## 2020-11-27 MED FILL — NORMAL SALINE FLUSH 0.9 % INJECTION SYRINGE: INTRAMUSCULAR | Qty: 40

## 2020-11-27 MED FILL — ROCURONIUM 10 MG/ML IV: 10 mg/mL | INTRAVENOUS | Qty: 10

## 2020-11-27 MED FILL — CEFAZOLIN 1 GRAM SOLUTION FOR INJECTION: 1 gram | INTRAMUSCULAR | Qty: 2000

## 2020-11-27 MED FILL — MIDAZOLAM 1 MG/ML IJ SOLN: 1 mg/mL | INTRAMUSCULAR | Qty: 2

## 2020-11-27 MED FILL — ACETAMINOPHEN 325 MG TABLET: 325 mg | ORAL | Qty: 2

## 2020-11-27 NOTE — Anesthesia Pre-Procedure Evaluation (Signed)
Relevant Problems   NEUROLOGY   (+) Generalized anxiety disorder      CARDIOVASCULAR   (+) Essential (primary) hypertension      GASTROINTESTINAL   (+) GERD (gastroesophageal reflux disease)      ENDOCRINE   (+) Acquired hypothyroidism       Anesthetic History   No history of anesthetic complications            Review of Systems / Medical History  Patient summary reviewed, nursing notes reviewed and pertinent labs reviewed    Pulmonary  Within defined limits                 Neuro/Psych   Within defined limits           Cardiovascular    Hypertension          Hyperlipidemia    Exercise tolerance: >4 METS     GI/Hepatic/Renal     GERD           Endo/Other      Hypothyroidism: well controlled  Obesity     Other Findings              Physical Exam    Airway  Mallampati: II  TM Distance: 4 - 6 cm  Neck ROM: normal range of motion   Mouth opening: Normal     Cardiovascular  Regular rate and rhythm,  S1 and S2 normal,  no murmur, click, rub, or gallop             Dental  No notable dental hx       Pulmonary  Breath sounds clear to auscultation               Abdominal  GI exam deferred       Other Findings            Anesthetic Plan    ASA: 2  Anesthesia type: general          Induction: Intravenous  Anesthetic plan and risks discussed with: Patient

## 2020-11-27 NOTE — Interval H&P Note (Signed)
[facility] [phone]) called and informed of patient's progress.

## 2020-11-27 NOTE — Anesthesia Post-Procedure Evaluation (Signed)
Post-Anesthesia Evaluation and Assessment    Patient: Teresa Beasley MRN: [id number]  SSN: [id number]    Date of Birth: 1980/03/28  Age: 40 y.o.  Sex: female      I have evaluated the patient and they are stable and ready for discharge from the PACU.     Cardiovascular Function/Vital Signs  Visit Vitals  BP 121/70 (BP Patient Position: At rest)   Pulse 67   Temp 36.6 ??C (97.8 ??F)   Resp 15   Ht 5\' 7"  (1.702 m)   Wt 97.5 kg (215 lb)   SpO2 94%   BMI 33.67 kg/m??       Patient is status post General anesthesia for Procedure(s):  ROBOTIC CHOLECYSTECTOMY, LYSIS OF ADHESIONS.    Nausea/Vomiting: None    Postoperative hydration reviewed and adequate.    Pain:  Pain Scale 1: Numeric (0 - 10) (11/27/20 1431)  Pain Intensity 1: 2 (11/27/20 1431)   Managed    Neurological Status:   Neuro (WDL): Within Defined Limits (11/27/20 1431)  Neuro  LUE Motor Response: Purposeful (11/27/20 1431)  LLE Motor Response: Purposeful (11/27/20 1431)  RUE Motor Response: Purposeful (11/27/20 1431)  RLE Motor Response: Purposeful (11/27/20 1431)   At baseline    Mental Status, Level of Consciousness: Alert and  oriented to person, place, and time    Pulmonary Status:   O2 Device: None (Room air) (11/27/20 1439)   Adequate oxygenation and airway patent    Complications related to anesthesia: None    Post-anesthesia assessment completed. No concerns    Signed By: 01/27/21, MD     November 27, 2020              Procedure(s):  ROBOTIC CHOLECYSTECTOMY, LYSIS OF ADHESIONS.    general    <BSHSIANPOST>    INITIAL Post-op Vital signs:   Vitals Value Taken Time   BP 121/70 11/27/20 1439   Temp 36.6 ??C (97.8 ??F) 11/27/20 1439   Pulse 67 11/27/20 1439   Resp 15 11/27/20 1439   SpO2 94 % 11/27/20 1439

## 2020-11-27 NOTE — Op Note (Signed)
OPERATIVE NOTE    Date of Procedure: 11/27/2020     Preoperative Diagnosis: SYMPTOMATIC CHOLELITHIASIS  Postoperative Diagnosis: SYMPTOMATIC CHOLELITHIASIS, intra-abdominal adhesions    Procedure: Procedure(s):  ROBOTIC CHOLECYSTECTOMY, LYSIS OF ADHESIONS    Surgeon: Darliss Cheney, MD    Surgical Staff: Circ-1: Christia Reading, RN  Circ-Relief: Theodoro Doing, RN  Scrub Tech-1: Maryln Gottron  Scrub Tech-Relief: Ward, Delories Heinz  Surg Asst-1: Jill Side    Anesthesia: General   Indications: 40 year old female with gallstones and history of ovarian cysts and possibly endometriosis who presents with right upper quadrant and right lower quadrant pain.  Here to have her gallbladder removed to rule out for etiology of right upper quadrant pain  Findings: Normal-appearing gallbladder with stones.  No signs of endometriosis.  Adhesions to the lower midline partially lysed    Description of Operation: Teresa Beasley was identified in the pre-operative holding area. Informed consent was obtained after a complete discussion of risks, benefits and alternatives to surgery were had with the patient.    The patient was brought back to the operating room and placed under general endotracheal anesthesia in the supine position on the operating room table with a foot board. The patient was then prepped and draped in the usual sterile fashion. A timeout was performed.      We then injected local anesthetic into the right lower abdomen.  Using a 5 mm trocar I entered the abdomen safely using Optiview technique.  We noted adhesions to the midline from her previous laparotomy incision.  I placed an additional 8 mm trocar along the right anterior axillary line.  I was able to get the scope around the midline adhesions and place another 8 mm trocar in the left mid abdomen.  I then used laparoscopic scissors to take down adhesions to the upper portion of the scar.  I was able to gain enough space to place my 12 mm supraumbilical trocar.  I then  upsized the entry trocar to an 8 mm trocar.  I used scissors and the Crown with energy to achieve hemostasis in the areas of adhesiolysis.  The patient was placed in steep reverse Trendelenburg.    The dome of the gallbladder was grasped with a prograsp and retracted anteriorly and laterally over the liver edge. The gallbladder appeared normal with stones. The infundibulum was grasped with a bipolar and retracted laterally. Using the hook cautery, the peritoneum of the gallbladder was incised around the infundibulum and lateral boarders. We identified the cystic duct and cystic artery and after further dissection obtained the critical view of safety. We confirmed biliary anatomy with intraoperative ICG. We then placed two Hem-o-loc clips proximally and one distally on the cystic artery. We then placed one Hem-o-loc on the proximal cystic duct and two Hem-o-loc clips around the cystic duct distally and transected the duct with scissors.    The gallbladder was then liberated from the gallbladder fossa. Hemostasis was confirmed and we ensured no spilled stones.      I then scrubbed back in and placed the gallbladder in an Endocatch bag.    I performed surveillance of the lower abdomen and did not note any signs of endometriosis or implants.    Next, we closed the 12 mm trocar site with 0 PDS on a trans-fascial suture passer. We then observed, via the 12 mm trocar, all other trocars being removed to ensure no hemorrhage. We then released the pneumoperitoneum and removed the 12 mm trocar. The specimen was removed  via the umbilical trocar site. The PDS suture was tied down. The dermis was approximated with 4-0 Monocryl suture followed by Dermabond for the skin.    At the end of the procedure all instrument, needle, and sponge counts were correct. I was present and scrubbed throughout the entirety of the case. The patient awoke from anesthesia and was extubated without complication and sent to PACU in stable  condition.      Estimated Blood Loss: 5 mL    Specimens:   ID Type Source Tests Collected by Time Destination   1 : GALLBLADDER Fresh Gallbladder  Darliss Cheney, MD 11/27/2020 1339 Pathology        Complications: None    Implants: * No implants in log *      Darliss Cheney, MD  Bariatric and General Surgeon  [facility]  11/27/2020

## 2020-11-27 NOTE — Progress Notes (Signed)
Discharge instructions reviewed with patient and family using teachback . All questions have been answered. Prescriptions sent to  pharmacy. Vital signs stable, pain appropriately managed. Patient wheeled off the unit with PACU staff.

## 2020-11-28 MED ORDER — BUSPIRONE 15 MG TAB
15 mg | ORAL_TABLET | ORAL | 2 refills | Status: AC
Start: 2020-11-28 — End: 2021-01-30

## 2020-12-05 NOTE — Telephone Encounter (Signed)
 I spoke with the patient. I asked if she has been experiencing a fever and she stated, No! I asked her to describe the drainage and she stated, It is puss and yellow. I asked her to take a picture and send it via MyChart. I will route it to him and upon his response call you back. She acknowledged understanding and thanked me for the call.

## 2020-12-05 NOTE — Telephone Encounter (Signed)
 I spoke with the patient after the doctor reviewed the pictures. I told her he said, It does not look like it is infected. Tell her to keep and eye on it. I also told her if it becomes painful, tender to touch or you start experiencing a fever please contact us . I did tell he did also agree you can cover it with a dry dressing daily. She asked if she could put some neosporin on the site

## 2020-12-05 NOTE — Telephone Encounter (Signed)
Patient called and stated that the area around her left incision site is red and pus has started draining from it. She stated that it is not hot or painful to the touch. She requested a call back on her work number and stated to ask the receptionist to speak to [place].

## 2020-12-07 ENCOUNTER — Ambulatory Visit
Admit: 2020-12-07 | Discharge: 2020-12-07 | Payer: PRIVATE HEALTH INSURANCE | Attending: Family Medicine | Primary: Family Medicine

## 2020-12-07 ENCOUNTER — Ambulatory Visit: Attending: Family Medicine | Primary: Family Medicine

## 2020-12-07 DIAGNOSIS — I119 Hypertensive heart disease without heart failure: Secondary | ICD-10-CM

## 2020-12-07 NOTE — Progress Notes (Signed)
Teresa Beasley is a 40 y.o. female and presents with Follow Up Chronic Condition  .  HPI     Subjective:  Cardiovascular Review:  The patient has hypertension   Diet and Lifestyle: generally follows a low fat low cholesterol diet, generally follows a low sodium diet, exercises sporadically  Home BP Monitoring: is not measured at home.  Pertinent ROS: taking medications as instructed, no medication side effects noted, no TIA's, no chest pain on exertion, no dyspnea on exertion, no swelling of ankles.     Review of Systems  Review of Systems   Constitutional: Negative.  Negative for chills and fever.   HENT: Negative.  Negative for congestion, ear discharge, hearing loss, nosebleeds and tinnitus.    Eyes: Negative.  Negative for blurred vision, double vision, photophobia and pain.   Respiratory: Negative.  Negative for cough, hemoptysis and sputum production.    Cardiovascular: Negative.  Negative for chest pain and palpitations.   Gastrointestinal: Negative.  Negative for heartburn, nausea and vomiting.   Genitourinary: Negative.  Negative for dysuria, frequency and urgency.   Musculoskeletal: Negative.  Negative for back pain and myalgias.   Skin: Negative.    Neurological: Negative.  Negative for dizziness, tingling, weakness and headaches.   Endo/Heme/Allergies: Negative.    Psychiatric/Behavioral: Negative.  Negative for depression and suicidal ideas. The patient does not have insomnia.    All other systems reviewed and are negative.      Past Medical History:   Diagnosis Date    Acquired hypothyroidism 11/02/2018    Anxiety     Bilateral ovarian cysts     Removal 03/24/2010    Ear problems     Essential hypertension 11/02/2018    Gastro-esophageal reflux disease with esophagitis 11/02/2018    Generalized anxiety disorder 11/02/2018    GERD (gastroesophageal reflux disease)     High cholesterol     Hypercholesterolemia     Hypernatremia 11/02/2018    Obesity 11/02/2018    Obesity (BMI 30-39.9) 11/02/2018     Otalgia of right ear 11/02/2018    URI (upper respiratory infection) 11/02/2018     Past Surgical History:   Procedure Laterality Date    HX ENDOSCOPY      HX GYN      HX OVARIAN CYST REMOVAL  2007    OVARIAN CYST & FALLOPIAN TUBE REMOVED BY Dr. Lorna Few WISDOM TEETH EXTRACTION      IR CHOLECYSTOSTOMY PERCUTANEOUS       Social History     Socioeconomic History    Marital status: MARRIED   Tobacco Use    Smoking status: Never    Smokeless tobacco: Never   Vaping Use    Vaping Use: Never used   Substance and Sexual Activity    Alcohol use: Not Currently    Drug use: Never    Sexual activity: Yes     Partners: Male     Birth control/protection: Implant     Comment: nuvaring     Social Determinants of Health     Financial Resource Strain: Low Risk     Difficulty of Paying Living Expenses: Not hard at all   Food Insecurity: No Food Insecurity    Worried About Programme researcher, broadcasting/film/video in the Last Year: Never true    Ran Out of Food in the Last Year: Never true     Family History   Problem Relation Age of Onset    Hypertension Mother  Mult Sclerosis Father     COPD Father     Emphysema Father     Thyroid Disease Sister     Anesth Problems Neg Hx      Current Outpatient Medications   Medication Sig Dispense Refill    busPIRone (BUSPAR) 15 mg tablet TAKE 1 TABLET BY MOUTH EVERY DAY AS NEEDED FOR ANXIETY 30 Tablet 2    polyethylene glycol (MIRALAX) 17 gram packet Take 1 Packet by mouth daily. May take 1-2 packets as needed daily for constipation. 14 Packet 1    levothyroxine (SYNTHROID) 25 mcg tablet TAKE 1 TABLET BY MOUTH EVERY DAY BEFORE BREAKFAST (Patient taking differently: Take 25 mcg by mouth nightly.) 90 Tablet 0    atorvastatin (LIPITOR) 20 mg tablet TAKE 1 TABLET BY MOUTH EVERY DAY (Patient taking differently: Take 20 mg by mouth every morning.) 90 Tablet 0    lisinopril-hydroCHLOROthiazide (PRINZIDE, ZESTORETIC) 20-12.5 mg per tablet TAKE 1 TABLET BY MOUTH EVERY DAY (Patient taking differently: Take 1 Tablet by  mouth every morning.) 90 Tablet 0    ethinyl estradiol-etonogestrel (NUVARING) 0.12-0.015 mg/24 hr vaginal ring by Intravaginal route.      omeprazole (PRILOSEC) 40 mg capsule Take 40 mg by mouth every morning.       No Known Allergies    Objective:  Visit Vitals  BP 123/70 (BP 1 Location: Left upper arm, BP Patient Position: Sitting, BP Cuff Size: Adult)   Pulse 93   Temp 98.9 ??F (37.2 ??C) (Oral)   Resp 16   Ht 5\' 7"  (1.702 m)   Wt 213 lb 12.8 oz (97 kg)   LMP 12/07/2020   SpO2 99%   BMI 33.49 kg/m??       Physical Exam:   Physical Exam  Vitals and nursing note reviewed.   Constitutional:       Appearance: Normal appearance. She is obese.   HENT:      Head: Normocephalic and atraumatic.      Right Ear: Tympanic membrane, ear canal and external ear normal.      Left Ear: Tympanic membrane, ear canal and external ear normal.      Nose: Nose normal.      Mouth/Throat:      Mouth: Mucous membranes are moist.      Pharynx: Oropharynx is clear. No oropharyngeal exudate or posterior oropharyngeal erythema.   Eyes:      General: No scleral icterus.        Right eye: No discharge.         Left eye: No discharge.      Extraocular Movements: Extraocular movements intact.      Conjunctiva/sclera: Conjunctivae normal.      Pupils: Pupils are equal, round, and reactive to light.   Neck:      Vascular: No carotid bruit.   Cardiovascular:      Rate and Rhythm: Normal rate.      Pulses: Normal pulses.      Heart sounds: Normal heart sounds. No murmur heard.    No gallop.   Pulmonary:      Effort: Pulmonary effort is normal. No respiratory distress.      Breath sounds: Normal breath sounds. No stridor. No wheezing, rhonchi or rales.   Chest:      Chest wall: No tenderness.   Abdominal:      General: Bowel sounds are normal. There is no distension.      Palpations: Abdomen is soft. There is no mass.  Tenderness: There is no abdominal tenderness. There is no right CVA tenderness, left CVA tenderness or rebound.      Hernia: No  hernia is present.   Musculoskeletal:         General: No swelling, tenderness, deformity or signs of injury. Normal range of motion.      Cervical back: Normal range of motion and neck supple. No rigidity. No muscular tenderness.      Right lower leg: No edema.      Left lower leg: No edema.   Lymphadenopathy:      Cervical: No cervical adenopathy.   Skin:     General: Skin is warm.      Capillary Refill: Capillary refill takes 2 to 3 seconds.      Coloration: Skin is not jaundiced or pale.      Findings: No bruising, erythema, lesion or rash.   Neurological:      General: No focal deficit present.      Mental Status: She is alert and oriented to person, place, and time.      Cranial Nerves: No cranial nerve deficit.      Sensory: No sensory deficit.      Motor: No weakness.      Coordination: Coordination normal.      Gait: Gait normal.      Deep Tendon Reflexes: Reflexes normal.   Psychiatric:         Mood and Affect: Mood normal.         Behavior: Behavior normal.         Thought Content: Thought content normal.         Judgment: Judgment normal.           Results for orders placed or performed during the hospital encounter of 11/27/20   HCG URINE, QL. 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 Gastroesophageal reflux disease with esophagitis without hemorrhage  K21.00 530.81      530.10       3. S/P cholecystectomy  Z90.49 V45.79     done on 11/27/20      4. Acquired hypothyroidism  E03.9 244.9       5. Anxiety  F41.9 300.00     stable on buspar        No orders of the defined types were placed in this encounter.    Cannot display discharge medications since this is not an admission.

## 2020-12-07 NOTE — Telephone Encounter (Signed)
Patient called stating the incision site is now oozing green. Patient is worried the site is infected. She stated she will send updated pictures through My Chart to Dr.Smith.     Patient stated if you are unable to reach her on her cell phone call the work number listed and ask for her.

## 2020-12-07 NOTE — Progress Notes (Signed)
 1. Have you been to the ER, urgent care clinic since your last visit?  Hospitalized since your last visit? Yes When: October 14 2020 Where: [facility] Reason for visit: gallstones and ovarian cyst    2. Have you seen or consulted any other health care providers outside of the [facility] since your last visit? Yes When: November 06 2020 Where: [place]  [facility] Heather Hoit Reason for visit: pap smear      3. For patients aged 23-75: Has the patient had a colonoscopy / FIT/ Cologuard? NA - based on age      If the patient is female:    4. For patients aged 54-74: Has the patient had a mammogram within the past 2 years? NA - based on age or sex      81. For patients aged 21-65: Has the patient had a pap smear? Yes - no Care Gap present     Chief Complaint   Patient presents with    Follow Up Chronic Condition     Visit Vitals  BP 123/70 (BP 1 Location: Left upper arm, BP Patient Position: Sitting, BP Cuff Size: Adult)   Pulse 93   Temp 98.9 F (37.2 C) (Oral)   Resp 16   Ht 5' 7 (1.702 m)   Wt 213 lb 12.8 oz (97 kg)   LMP 12/07/2020   SpO2 99%   BMI 33.49 kg/m     Pt is here for a follow up.

## 2020-12-10 ENCOUNTER — Ambulatory Visit
Admit: 2020-12-10 | Discharge: 2020-12-10 | Payer: PRIVATE HEALTH INSURANCE | Attending: Family | Primary: Family Medicine

## 2020-12-10 ENCOUNTER — Ambulatory Visit: Attending: Family | Primary: Family Medicine

## 2020-12-10 DIAGNOSIS — Z09 Encounter for follow-up examination after completed treatment for conditions other than malignant neoplasm: Secondary | ICD-10-CM

## 2020-12-10 NOTE — Progress Notes (Signed)
1. Have you been to the ER, urgent care clinic since your last visit?  Hospitalized since your last visit?No    2. Have you seen or consulted any other health care providers outside of the [facility] since your last visit?  Include any pap smears or colon screening. No

## 2020-12-10 NOTE — Progress Notes (Signed)
CC: Post Operative state    Subjective:     Teresa Beasley is a 40 y.o. female presents for postop care 2 weeks s/p ROBOTIC CHOLECYSTECTOMY, LYSIS OF ADHESIONS.    Patient doing well postoperatively. She states she was having some itching, redness,and clear yellow drainage from her far left lap site, but was told by Dr. Katrinka Blazing to remove the glue and use hydrocortisone cream on the site.  She says itching, redness and drainage has all resolved and she is not longer having any problems with any of her lap sites.   Appetite is good.  No nausea/vomiting.   Tolerating a regular diet.    Bowel movements are regular.  The patient is voiding without difficulty. Reports normal yellow urine.    The patient is not having any pain..  Patient denies fever, chest pain, or shortness of breath.     Review of Systems:  A comprehensive review of systems was negative except for that written above      Teresa Beasley has a reminder for a "due or due soon" health maintenance. I have asked that she contact her primary care provider for follow-up on this health maintenance.      Objective:     Visit Vitals  BP 124/77 (BP 1 Location: Left upper arm, BP Patient Position: Sitting, BP Cuff Size: Adult)   Pulse 91   Temp 98.4 ??F (36.9 ??C) (Oral)   Resp 16   Ht 5\' 7"  (1.702 m)   Wt 218 lb (98.9 kg)   LMP 12/07/2020   SpO2 98%   BMI 34.14 kg/m??       General: alert, cooperative, no distress, appears stated age  CV: Regular rate and rhythm  Pulmonary: Lungs clear to auscultation   Abdomen:soft, bowel sounds active, non-tender  Lap sites:  healing well, no drainage, no erythema or induration, no swelling, no dehiscence, incision well approximated.  No signs of infection     Assessment:       ICD-10-CM ICD-9-CM    1. Postoperative examination  Z09 V67.00           Plan:   2 weeks s/p ROBOTIC CHOLECYSTECTOMY, LYSIS OF ADHESIONS.  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     Wound care discussed. May submerge in water.  wash with soap and water.   May moisturize as needed.  We discussed the importance of proper diet post op- Caution with fatty / fried foods as can cause some stomach upset and diarrhea.   No lifting greater than 20 lbs x1 week then 30 lbs x1 week then may advance activity as tolerated.  Teresa Beasley verbalized understanding and questions were answered to the best of my knowledge and ability.   Instructed patient to call with any questions or concerns.  Discharged from surgical care with prn follow up         > 10 minutes were spent with patient with greater than 50% of that time spent face to face counseling    Clint Guy, NP  [facility]   12/10/2020

## 2020-12-15 ENCOUNTER — Inpatient Hospital Stay
Admit: 2020-12-15 | Discharge: 2020-12-18 | Disposition: A | Discharge status: Home / Self Care | Payer: PRIVATE HEALTH INSURANCE | Source: Other Acute Inpatient Hospital | Attending: Specialist | Admitting: Specialist

## 2020-12-15 DIAGNOSIS — R1900 Intra-abdominal and pelvic swelling, mass and lump, unspecified site: Secondary | ICD-10-CM

## 2020-12-15 LAB — COMPREHENSIVE METABOLIC PANEL
ALT: 14 U/L (ref 12–78)
AST: 7 U/L — ABNORMAL LOW (ref 15–37)
Albumin/Globulin Ratio: 0.8 — ABNORMAL LOW (ref 1.1–2.2)
Albumin: 3 g/dL — ABNORMAL LOW (ref 3.5–5.0)
Alkaline Phosphatase: 91 U/L (ref 45–117)
Anion Gap: 6 mmol/L (ref 5–15)
BUN: 7 MG/DL (ref 6–20)
Bun/Cre Ratio: 10 — ABNORMAL LOW (ref 12–20)
CO2: 23 mmol/L (ref 21–32)
Calcium: 8.4 MG/DL — ABNORMAL LOW (ref 8.5–10.1)
Chloride: 111 mmol/L — ABNORMAL HIGH (ref 97–108)
Creatinine: 0.67 MG/DL (ref 0.55–1.02)
EGFR IF NonAfrican American: >60 mL/min/{1.73_m2} (ref >60)
GFR African American: >60 mL/min/{1.73_m2} (ref >60)
Globulin: 3.9 g/dL (ref 2.0–4.0)
Glucose: 104 mg/dL — ABNORMAL HIGH (ref 65–100)
Potassium: 3.4 mmol/L — ABNORMAL LOW (ref 3.5–5.1)
Sodium: 140 mmol/L (ref 136–145)
Total Bilirubin: 0.2 MG/DL (ref 0.2–1.0)
Total Protein: 6.9 g/dL (ref 6.4–8.2)

## 2020-12-15 LAB — CBC WITH AUTO DIFFERENTIAL
Basophils %: 1 % (ref 0–1)
Basophils Absolute: 0.1 10*3/uL (ref 0.0–0.1)
Eosinophils %: 4 % (ref 0–7)
Eosinophils Absolute: 0.4 10*3/uL (ref 0.0–0.4)
Granulocyte Absolute Count: 0.1 10*3/uL — ABNORMAL HIGH (ref 0.00–0.04)
Hematocrit: 27.4 % — ABNORMAL LOW (ref 35.0–47.0)
Hemoglobin: 8.3 g/dL — ABNORMAL LOW (ref 11.5–16.0)
Immature Granulocytes: 0 % (ref 0.0–0.5)
Lymphocytes %: 13 % (ref 12–49)
Lymphocytes Absolute: 1.6 10*3/uL (ref 0.8–3.5)
MCH: 23.9 PG — ABNORMAL LOW (ref 26.0–34.0)
MCHC: 30.3 g/dL (ref 30.0–36.5)
MCV: 78.7 FL — ABNORMAL LOW (ref 80.0–99.0)
MPV: 10.6 FL (ref 8.9–12.9)
Monocytes %: 7 % (ref 5–13)
Monocytes Absolute: 0.8 10*3/uL (ref 0.0–1.0)
NRBC Absolute: 0 10*3/uL (ref 0.00–0.01)
Neutrophils %: 75 % (ref 32–75)
Neutrophils Absolute: 9 10*3/uL — ABNORMAL HIGH (ref 1.8–8.0)
Nucleated RBCs: 0 PER 100 WBC
Platelets: 327 10*3/uL (ref 150–400)
RBC: 3.48 M/uL — ABNORMAL LOW (ref 3.80–5.20)
RDW: 14.6 % — ABNORMAL HIGH (ref 11.5–14.5)
WBC: 11.9 10*3/uL — ABNORMAL HIGH (ref 3.6–11.0)

## 2020-12-15 LAB — URINALYSIS WITH MICROSCOPIC
BACTERIA, URINE: NEGATIVE /hpf
Bilirubin, Urine: NEGATIVE
Glucose, Ur: NEGATIVE mg/dL
Ketones, Urine: NEGATIVE mg/dL
Nitrite, Urine: NEGATIVE
Protein, UA: NEGATIVE mg/dL
Specific Gravity, UA: 1.025
Urobilinogen, UA, POCT: 0.2 EU/dL (ref 0.2–1.0)
pH, UA: 5.5 (ref 5.0–8.0)

## 2020-12-15 LAB — URINALYSIS W/MICROSCOPIC
Bacteria: NEGATIVE /hpf
Bilirubin: NEGATIVE
Glucose: NEGATIVE mg/dL
Ketone: NEGATIVE mg/dL
Nitrites: NEGATIVE
Protein: NEGATIVE mg/dL
Specific gravity: 1.025
Urobilinogen: 0.2 EU/dL (ref 0.2–1.0)
pH (UA): 5.5 (ref 5.0–8.0)

## 2020-12-15 LAB — METABOLIC PANEL, COMPREHENSIVE
A-G Ratio: 0.8 — ABNORMAL LOW (ref 1.1–2.2)
ALT (SGPT): 14 U/L (ref 12–78)
AST (SGOT): 7 U/L — ABNORMAL LOW (ref 15–37)
Albumin: 3 g/dL — ABNORMAL LOW (ref 3.5–5.0)
Alk. phosphatase: 91 U/L (ref 45–117)
Anion gap: 6 mmol/L (ref 5–15)
BUN/Creatinine ratio: 10 — ABNORMAL LOW (ref 12–20)
BUN: 7 MG/DL (ref 6–20)
Bilirubin, total: 0.2 MG/DL (ref 0.2–1.0)
CO2: 23 mmol/L (ref 21–32)
Calcium: 8.4 MG/DL — ABNORMAL LOW (ref 8.5–10.1)
Chloride: 111 mmol/L — ABNORMAL HIGH (ref 97–108)
Creatinine: 0.67 MG/DL (ref 0.55–1.02)
GFR est AA: >60 mL/min/{1.73_m2} (ref >60)
GFR est non-AA: >60 mL/min/{1.73_m2} (ref >60)
Globulin: 3.9 g/dL (ref 2.0–4.0)
Glucose: 104 mg/dL — ABNORMAL HIGH (ref 65–100)
Potassium: 3.4 mmol/L — ABNORMAL LOW (ref 3.5–5.1)
Protein, total: 6.9 g/dL (ref 6.4–8.2)
Sodium: 140 mmol/L (ref 136–145)

## 2020-12-15 LAB — CBC WITH AUTOMATED DIFF
ABS. BASOPHILS: 0.1 10*3/uL (ref 0.0–0.1)
ABS. EOSINOPHILS: 0.4 10*3/uL (ref 0.0–0.4)
ABS. IMM. GRANS.: 0.1 10*3/uL — ABNORMAL HIGH (ref 0.00–0.04)
ABS. LYMPHOCYTES: 1.6 10*3/uL (ref 0.8–3.5)
ABS. MONOCYTES: 0.8 10*3/uL (ref 0.0–1.0)
ABS. NEUTROPHILS: 9 10*3/uL — ABNORMAL HIGH (ref 1.8–8.0)
ABSOLUTE NRBC: 0 10*3/uL (ref 0.00–0.01)
BASOPHILS: 1 % (ref 0–1)
EOSINOPHILS: 4 % (ref 0–7)
HCT: 27.4 % — ABNORMAL LOW (ref 35.0–47.0)
HGB: 8.3 g/dL — ABNORMAL LOW (ref 11.5–16.0)
IMMATURE GRANULOCYTES: 0 % (ref 0.0–0.5)
LYMPHOCYTES: 13 % (ref 12–49)
MCH: 23.9 PG — ABNORMAL LOW (ref 26.0–34.0)
MCHC: 30.3 g/dL (ref 30.0–36.5)
MCV: 78.7 FL — ABNORMAL LOW (ref 80.0–99.0)
MONOCYTES: 7 % (ref 5–13)
MPV: 10.6 FL (ref 8.9–12.9)
NEUTROPHILS: 75 % (ref 32–75)
NRBC: 0 PER 100 WBC
PLATELET: 327 10*3/uL (ref 150–400)
RBC: 3.48 M/uL — ABNORMAL LOW (ref 3.80–5.20)
RDW: 14.6 % — ABNORMAL HIGH (ref 11.5–14.5)
WBC: 11.9 10*3/uL — ABNORMAL HIGH (ref 3.6–11.0)

## 2020-12-15 MED ORDER — .PHARMACY TO SUBSTITUTE PER PROTOCOL
Status: DC | PRN
Start: 2020-12-15 — End: 2020-12-15

## 2020-12-15 MED ORDER — SODIUM CHLORIDE 0.9 % IV
INTRAVENOUS | Status: DC
Start: 2020-12-15 — End: 2020-12-18
  Administered 2020-12-15 – 2020-12-18 (×5): via INTRAVENOUS

## 2020-12-15 MED ORDER — SODIUM CHLORIDE 0.9 % IJ SYRG
Freq: Three times a day (TID) | INTRAMUSCULAR | Status: AC
Start: 2020-12-15 — End: 2020-12-18
  Administered 2020-12-16 – 2020-12-18 (×5): via INTRAVENOUS

## 2020-12-15 MED ORDER — BUSPIRONE 5 MG TAB
5 mg | Freq: Every evening | ORAL | Status: AC
Start: 2020-12-15 — End: 2020-12-18
  Administered 2020-12-16 – 2020-12-18 (×4): via ORAL

## 2020-12-15 MED ORDER — KETOROLAC TROMETHAMINE 30 MG/ML INJECTION
301 mg/mL (1 mL) | Freq: Four times a day (QID) | INTRAMUSCULAR | Status: DC | PRN
Start: 2020-12-15 — End: 2020-12-18
  Administered 2020-12-15 – 2020-12-18 (×11): via INTRAVENOUS

## 2020-12-15 MED ORDER — LEVOTHYROXINE 25 MCG TAB
25 mcg | Freq: Every evening | ORAL | Status: AC
Start: 2020-12-15 — End: 2020-12-18
  Administered 2020-12-15 – 2020-12-18 (×4): via ORAL

## 2020-12-15 MED ORDER — LISINOPRIL 20 MG TAB
20 mg | Freq: Every day | ORAL | Status: AC
Start: 2020-12-15 — End: 2020-12-18
  Administered 2020-12-15 – 2020-12-18 (×3): via ORAL

## 2020-12-15 MED ORDER — HYDROMORPHONE 1 MG/ML INJECTION SOLUTION
1 mg/mL | INTRAMUSCULAR | Status: AC | PRN
Start: 2020-12-15 — End: 2020-12-17
  Administered 2020-12-15 (×2): via INTRAVENOUS

## 2020-12-15 MED ORDER — PROCHLORPERAZINE EDISYLATE 5 MG/ML INJECTION
5 mg/mL | Freq: Four times a day (QID) | INTRAMUSCULAR | Status: AC | PRN
Start: 2020-12-15 — End: 2020-12-18
  Administered 2020-12-16 – 2020-12-17 (×2): via INTRAVENOUS

## 2020-12-15 MED ORDER — LEVOFLOXACIN IN D5W 500 MG/100 ML IV PIGGY BACK
500 mg/100 mL | INTRAVENOUS | Status: AC
Start: 2020-12-15 — End: 2020-12-20
  Administered 2020-12-15 – 2020-12-17 (×3): via INTRAVENOUS

## 2020-12-15 MED ORDER — ACETAMINOPHEN 650 MG RECTAL SUPPOSITORY
650 mg | Freq: Four times a day (QID) | RECTAL | Status: DC | PRN
Start: 2020-12-15 — End: 2020-12-17

## 2020-12-15 MED ORDER — SODIUM CHLORIDE 0.9 % IJ SYRG
INTRAMUSCULAR | Status: AC | PRN
Start: 2020-12-15 — End: 2020-12-18

## 2020-12-15 MED ORDER — POLYETHYLENE GLYCOL 3350 17 GRAM (100 %) ORAL POWDER PACKET
17 gram | Freq: Every day | ORAL | Status: AC
Start: 2020-12-15 — End: 2020-12-16
  Administered 2020-12-15 – 2020-12-16 (×2): via ORAL

## 2020-12-15 MED ORDER — POLYETHYLENE GLYCOL 3350 17 GRAM (100 %) ORAL POWDER PACKET
17 gram | Freq: Every day | ORAL | Status: AC | PRN
Start: 2020-12-15 — End: 2020-12-16

## 2020-12-15 MED ORDER — PANTOPRAZOLE 20 MG TAB, DELAYED RELEASE
20 mg | Freq: Every day | ORAL | Status: AC
Start: 2020-12-15 — End: 2020-12-18
  Administered 2020-12-15 – 2020-12-18 (×3): via ORAL

## 2020-12-15 MED ORDER — OXYCODONE-ACETAMINOPHEN 5 MG-325 MG TAB
5-325 mg | ORAL | Status: AC | PRN
Start: 2020-12-15 — End: 2020-12-18
  Administered 2020-12-16 – 2020-12-18 (×10): via ORAL

## 2020-12-15 MED ORDER — ONDANSETRON (PF) 4 MG/2 ML INJECTION
4 mg/2 mL | Freq: Four times a day (QID) | INTRAMUSCULAR | Status: AC | PRN
Start: 2020-12-15 — End: 2020-12-18
  Administered 2020-12-15: 21:00:00 via INTRAVENOUS

## 2020-12-15 MED ORDER — ONDANSETRON 4 MG TAB, RAPID DISSOLVE
4 mg | Freq: Three times a day (TID) | ORAL | Status: AC | PRN
Start: 2020-12-15 — End: 2020-12-18

## 2020-12-15 MED ORDER — ATORVASTATIN 20 MG TAB
20 mg | Freq: Every day | ORAL | Status: AC
Start: 2020-12-15 — End: 2020-12-18
  Administered 2020-12-16 – 2020-12-18 (×2): via ORAL

## 2020-12-15 MED ORDER — ACETAMINOPHEN 325 MG TABLET
325 mg | Freq: Four times a day (QID) | ORAL | Status: AC | PRN
Start: 2020-12-15 — End: 2020-12-17
  Administered 2020-12-15 (×2): via ORAL

## 2020-12-15 MED FILL — ONDANSETRON (PF) 4 MG/2 ML INJECTION: 4 mg/2 mL | INTRAMUSCULAR | Qty: 2

## 2020-12-15 MED FILL — SODIUM CHLORIDE 0.9 % IV: INTRAVENOUS | Qty: 1000

## 2020-12-15 MED FILL — MIRALAX 17 GRAM ORAL POWDER PACKET: 17 gram | ORAL | Qty: 1

## 2020-12-15 MED FILL — LEVOFLOXACIN IN D5W 500 MG/100 ML IV PIGGY BACK: 500 mg/100 mL | INTRAVENOUS | Qty: 100

## 2020-12-15 MED FILL — HYDROCHLOROTHIAZIDE 25 MG TAB: 25 mg | ORAL | Qty: 1

## 2020-12-15 MED FILL — PANTOPRAZOLE 20 MG TAB, DELAYED RELEASE: 20 mg | ORAL | Qty: 1

## 2020-12-15 MED FILL — ACETAMINOPHEN 325 MG TABLET: 325 mg | ORAL | Qty: 2

## 2020-12-15 MED FILL — NORMAL SALINE FLUSH 0.9 % INJECTION SYRINGE: INTRAMUSCULAR | Qty: 40

## 2020-12-15 MED FILL — KETOROLAC TROMETHAMINE 30 MG/ML INJECTION: 30 mg/mL (1 mL) | INTRAMUSCULAR | Qty: 1

## 2020-12-15 MED FILL — SYNTHROID 25 MCG TABLET: 25 mcg | ORAL | Qty: 1

## 2020-12-15 MED FILL — HYDROMORPHONE (PF) 1 MG/ML IJ SOLN: 1 mg/mL | INTRAMUSCULAR | Qty: 1

## 2020-12-15 NOTE — Progress Notes (Signed)
Bedside and Verbal shift change report given to Robyn RN (oncoming nurse) by Tria RN (offgoing nurse). Report included the following information SBAR, Kardex, Intake/Output, MAR, and Recent Results.

## 2020-12-15 NOTE — Progress Notes (Signed)
Bedside and Verbal shift change report given to Junie Panning RN (oncoming nurse) by Osker Mason (offgoing nurse). Report included the following information SBAR, Kardex, Intake/Output, MAR, and Recent Results.

## 2020-12-15 NOTE — H&P (Signed)
H&P by Annia Friendly, MD at  12/15/20 [phone]                Author: Annia Friendly, MD  Service: Gynecologic Oncology  Author Type: Physician       Filed: 12/15/20 1141  Date of Service: 12/15/20 0444  Status: Signed          Editor: Annia Friendly, MD (Physician)               [facility]   [address], [address]   [place], [place] [address]   P [phone]   F [phone]      History and Physical                Teresa Beasley     [id number]  DOB:  1980/10/10             Admitted:  12/15/2020  Date:  12/15/2020          Subjective:        Teresa Beasley is a 40 y.o. Caucasian premenopausal female who is being seen for a pelvic mass and pelvic pain.   She was accepted in transfer from [facility].       She has been having increasing abdominal pain since April of this year.  She was seeing her gynecologist in [place] about it but they told her that it was not gynecologic in origin.  Earlier this month, on 11/27/20 she underwent a robotic assisted cholecystectomy  with Dr. Truitt Leep here at [facility].  On reviewing her operative report, there was no mention of the pelvis or of a mass.  There did appear to be peritoneal and omental adhesions secondary to her prior laparotomy.        She presented to the ER yesterday due to an acute increase in pain, associated with nausea.  She denies fever/chills.  She reports regular menses and uses the Nuva ring for birth control.       She is G4P4 with four vaginal deliveries.  She underwent a left salpingooophorectomy by vertical midline laparotomy in 2007 to remove a "grapefruit-sized" mass, when she lived in [place].            Patient Active Problem List           Diagnosis  Date Noted         ?  Pelvic mass  12/15/2020     ?  S/P cholecystectomy  12/07/2020     ?  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  Mixed hyperlipidemia  02/23/2019     ?  Anxiety  11/12/2018     ?  Essential (primary)  hypertension  11/12/2018     ?  Obesity (BMI 30-39.9)  11/02/2018     ?  Gastro-esophageal reflux disease with esophagitis  11/02/2018     ?  Generalized anxiety disorder  11/02/2018     ?  Acquired hypothyroidism  11/02/2018     ?  Otalgia of right ear  11/02/2018     ?  URI (upper respiratory infection)  11/02/2018     ?  Hypernatremia  11/02/2018     ?  Ear problems           ?  GERD (gastroesophageal reflux disease)            Past Medical History:        Diagnosis  Date         ?  Acquired hypothyroidism  11/02/2018     ?  Anxiety       ?  Bilateral ovarian cysts            Removal 03/24/2010         ?  Ear problems       ?  Essential hypertension  11/02/2018     ?  Gastro-esophageal reflux disease with esophagitis  11/02/2018     ?  Generalized anxiety disorder  11/02/2018     ?  GERD (gastroesophageal reflux disease)       ?  High cholesterol       ?  Hypercholesterolemia       ?  Hypernatremia  11/02/2018     ?  Obesity  11/02/2018     ?  Obesity (BMI 30-39.9)  11/02/2018     ?  Otalgia of right ear  11/02/2018         ?  URI (upper respiratory infection)  11/02/2018           Past Surgical History:         Procedure  Laterality  Date          ?  HX CHOLECYSTECTOMY    11/27/2020     ?  HX ENDOSCOPY         ?  HX GYN         ?  HX OVARIAN CYST REMOVAL    2007          OVARIAN CYST & FALLOPIAN TUBE REMOVED BY Dr. Daphine Deutscher          ?  HX WISDOM TEETH EXTRACTION              ?  IR CHOLECYSTOSTOMY PERCUTANEOUS               Social History          Tobacco Use         ?  Smoking status:  Never     ?  Smokeless tobacco:  Never       Substance Use Topics         ?  Alcohol use:  Not Currently           Family History         Problem  Relation  Age of Onset          ?  Hypertension  Mother       ?  Mult Sclerosis  Father       ?  COPD  Father       ?  Emphysema  Father       ?  Thyroid Disease  Sister            ?  Anesth Problems  Neg Hx             No current facility-administered medications for this encounter.         No Known Allergies       Review of Systems:   A comprehensive review of systems was negative except for that written in the History of Present Illness. , 10 point ROS        Objective:        Physical Exam:    Visit Vitals      BP  137/87     Pulse  89     Temp  98.6 ??  F (37 ??C)     Resp  18     LMP  12/07/2020        SpO2  97%           General:   Alert, cooperative, no distress, appears stated age.     HEENT:   WNL.     Neck:  Supple, without masses.     Back:    Symmetric, no curvature. ROM normal. No CVA tenderness.     Lungs:    Clear to auscultation bilaterally.        Chest wall:   No tenderness or deformity.        Heart:   Regular rate and rhythm.        Breast Exam:   Deferred.        Abdomen:    Soft, non-distended, mildly tender.  No guarding/rebound.        Pelvic:   Deferred.        Rectal:   Deferred.     Extremities:  Extremities normal, atraumatic, no cyanosis or edema.     Skin:  Skin color, texture, turgor normal. No rashes or lesions.     Lymph nodes:  Cervical, supraclavicular, and axillary nodes normal.        Neurologic:  Grossly intact.            Labs:  No results found for this or any previous visit (from the past 24 hour(s)).      Imaging:    CT of abdomen/pelvis (12/14/20)   Right adnexal mass measuring 10.5 x 7.5 x 7.0 cm, exerting mass effect on the right ureter, with right-sided hydronephrosis.  The uterus appears normal.  The left adnexa is surgically absent.                              Assessment:/Plan:        40 yo WF with a complex, cystic mass of the right adnexa, likely ovarian in origin, but could be tubal.  I have personally reviewed the CT images (noted above).  I discussed with her the differential diagnosis, including both benign and malignant etiologies.   I recommend proceeding with laparoscopic evaluation and resection of the mass.  She would like to proceed with hysterectomy at the same time.  Based upon my review of the images, the mass appears to be adherent to the  uterus, so we would likely need  to perform a hysterectomy anyway.  I don't think we need to take her to the OR emergently over the weekend, so I will plan on posting her for Monday.  Hopefully we will be able to complete the procedure laparoscopically and not need to perform a laparotomy.   We will send the mass for frozen section pathology to determine the need for staging.  She was counseled on the risks, benefits, indications and alternatives of the planned procedure.  We discussed the possible surgical risks of bleeding, infection,  potential injury to other organs/structures, and the risks of anesthesia.  Her questions were answered to her satisfaction and she wishes to proceed as planned.  We will also treat her for a UTI that was identified at the ER.             Signed By:  Annia Friendly, MD           December 15, 2020

## 2020-12-16 LAB — CULTURE, URINE
Culture result:: NO GROWTH
Culture: NO GROWTH

## 2020-12-16 MED ORDER — POLYETHYLENE GLYCOL 3350 17 GRAM (100 %) ORAL POWDER PACKET
17 gram | Freq: Once | ORAL | Status: AC
Start: 2020-12-16 — End: 2020-12-16
  Administered 2020-12-16: 18:00:00 via ORAL

## 2020-12-16 MED FILL — KETOROLAC TROMETHAMINE 30 MG/ML INJECTION: 30 mg/mL (1 mL) | INTRAMUSCULAR | Qty: 1

## 2020-12-16 MED FILL — MIRALAX 17 GRAM ORAL POWDER PACKET: 17 gram | ORAL | Qty: 2

## 2020-12-16 MED FILL — LIPITOR 20 MG TABLET: 20 mg | ORAL | Qty: 1

## 2020-12-16 MED FILL — MIRALAX 17 GRAM ORAL POWDER PACKET: 17 gram | ORAL | Qty: 1

## 2020-12-16 MED FILL — ONDANSETRON (PF) 4 MG/2 ML INJECTION: 4 mg/2 mL | INTRAMUSCULAR | Qty: 2

## 2020-12-16 MED FILL — PROCHLORPERAZINE EDISYLATE 5 MG/ML INJECTION: 5 mg/mL | INTRAMUSCULAR | Qty: 2

## 2020-12-16 MED FILL — BUSPIRONE 5 MG TAB: 5 mg | ORAL | Qty: 3

## 2020-12-16 MED FILL — OXYCODONE-ACETAMINOPHEN 5 MG-325 MG TAB: 5-325 mg | ORAL | Qty: 1

## 2020-12-16 MED FILL — SYNTHROID 25 MCG TABLET: 25 mcg | ORAL | Qty: 1

## 2020-12-16 MED FILL — PANTOPRAZOLE 20 MG TAB, DELAYED RELEASE: 20 mg | ORAL | Qty: 1

## 2020-12-16 MED FILL — HYDROCHLOROTHIAZIDE 25 MG TAB: 25 mg | ORAL | Qty: 1

## 2020-12-16 MED FILL — LEVOFLOXACIN IN D5W 500 MG/100 ML IV PIGGY BACK: 500 mg/100 mL | INTRAVENOUS | Qty: 100

## 2020-12-16 NOTE — Progress Notes (Signed)
[facility]  [address] [place] [address]  P [phone]   F [phone]       Patient Name: Teresa Beasley   Admit Date: 12/15/2020   OR Date: 12/17/2020   Visit Date: 12/16/2020        SUBJECTIVE:    Feeling better this AM.  Pain much better controlled.     OBJECTIVE:    Patient Vitals for the past 24 hrs:   Temp Pulse Resp BP SpO2   12/16/20 0806 99 ??F (37.2 ??C) 83 16 118/83 97 %   12/16/20 0245 98.3 ??F (36.8 ??C) 82 16 109/78 96 %   12/15/20 2127 98.2 ??F (36.8 ??C) 93 16 (!) 128/98 96 %   12/15/20 1600 99.3 ??F (37.4 ??C) 82 16 116/72 97 %           Physical Exam     General:  alert, cooperative, no distress     Cardiac:  Regular rate and rhythm        Lungs:  clear to auscultation bilaterally  Abdomen:  Soft, ND, mildly tender in RLQ.       Wound:   n/a   Extremity: extremities normal, atraumatic, no cyanosis or edema    Data Review  Lab Results   Component Value Date/Time    WBC 11.9 (H) 12/15/2020 05:42 AM    ABS. NEUTROPHILS 9.0 (H) 12/15/2020 05:42 AM    HGB 8.3 (L) 12/15/2020 05:42 AM    HCT 27.4 (L) 12/15/2020 05:42 AM    MCV 78.7 (L) 12/15/2020 05:42 AM    MCH 23.9 (L) 12/15/2020 05:42 AM    PLATELET 327 12/15/2020 05:42 AM     Lab Results   Component Value Date/Time    Sodium 140 12/15/2020 05:42 AM    Potassium 3.4 (L) 12/15/2020 05:42 AM    Chloride 111 (H) 12/15/2020 05:42 AM    CO2 23 12/15/2020 05:42 AM    Glucose 104 (H) 12/15/2020 05:42 AM    BUN 7 12/15/2020 05:42 AM    Creatinine 0.67 12/15/2020 05:42 AM    Calcium 8.4 (L) 12/15/2020 05:42 AM    Albumin 3.0 (L) 12/15/2020 05:42 AM    Bilirubin, total 0.2 12/15/2020 05:42 AM    ALT (SGPT) 14 12/15/2020 05:42 AM    Alk. phosphatase 91 12/15/2020 05:42 AM     IMPRESSION/PLAN:    Gynecologic:  Complex right adnexal mass.  This is most likely a benign process.  I suspect a hemorrhagic cyst, possibly an endometrioma.  Plan is to take to the OR tomorrow for resection.  We will also plan to perform a hysterectomy  at the same time.    Heme/CV:  Hemodynamically stable.    Renal:  Good UOP.  Normal creatinine.       FEN/GI:  Will switch to clear liquids after lunch and begin gentle bowel prep with Miralax this afternoon.      ID/Wound:  Afebrile.  UTI noted on admission.  Currently on Levaquin.    PPX:  Ambulation.     Dispostion:  To the OR tomorrow for resection.  If we are able to perform the procedure laparoscopically, she should be able to go home the following day.          Albertha Ghee, MD  12/16/2020  9:10 AM

## 2020-12-16 NOTE — Progress Notes (Signed)
Bedside and Verbal shift change report given to Keanna RN (oncoming nurse) by Kelly RN (offgoing nurse). Report included the following information SBAR, Kardex, Intake/Output, MAR, and Recent Results.

## 2020-12-16 NOTE — Progress Notes (Signed)
Bedside and Verbal shift change report given to [employer] (Cabin crew) by Junie Panning (offgoing nurse). Report included the following information SBAR, Kardex, MAR, Accordion, and Recent Results.

## 2020-12-17 LAB — HCG URINE, QL. - POC
HCG, Pregnancy, Urine, POC: NEGATIVE
Pregnancy test,urine (POC): NEGATIVE

## 2020-12-17 MED ORDER — FENTANYL CITRATE (PF) 50 MCG/ML IJ SOLN
50 mcg/mL | INTRAMUSCULAR | Status: DC | PRN
Start: 2020-12-17 — End: 2020-12-17
  Administered 2020-12-17 (×2): via INTRAVENOUS

## 2020-12-17 MED ORDER — LACTATED RINGERS IV
INTRAVENOUS | Status: DC
Start: 2020-12-17 — End: 2020-12-17
  Administered 2020-12-17: 14:00:00 via INTRAVENOUS

## 2020-12-17 MED ORDER — LACTATED RINGERS IV
INTRAVENOUS | Status: DC
Start: 2020-12-17 — End: 2020-12-17

## 2020-12-17 MED ORDER — GLYCOPYRROLATE 0.2 MG/ML IJ SOLN
0.2 mg/mL | Freq: Once | INTRAMUSCULAR | Status: DC | PRN
Start: 2020-12-17 — End: 2020-12-17

## 2020-12-17 MED ORDER — ALBUTEROL SULFATE 0.083 % (0.83 MG/ML) SOLN FOR INHALATION
2.5 mg /3 mL (0.083 %) | RESPIRATORY_TRACT | Status: DC | PRN
Start: 2020-12-17 — End: 2020-12-17

## 2020-12-17 MED ORDER — ONDANSETRON (PF) 4 MG/2 ML INJECTION
4 mg/2 mL | INTRAMUSCULAR | Status: AC | PRN
Start: 2020-12-17 — End: 2020-12-18

## 2020-12-17 MED ORDER — HYDROMORPHONE (PF) 1 MG/ML IJ SOLN
1 mg/mL | INTRAMUSCULAR | Status: AC
Start: 2020-12-17 — End: ?

## 2020-12-17 MED ORDER — BUPIVACAINE (PF) 0.25 % (2.5 MG/ML) IJ SOLN
0.25 % (2.5 mg/mL) | INTRAMUSCULAR | Status: AC
Start: 2020-12-17 — End: ?

## 2020-12-17 MED ORDER — HYDROCODONE-ACETAMINOPHEN 5 MG-325 MG TAB
5-325 mg | ORAL | Status: DC | PRN
Start: 2020-12-17 — End: 2020-12-17

## 2020-12-17 MED ORDER — GLYCOPYRROLATE 0.2 MG/ML IJ SOLN
0.2 mg/mL | INTRAMUSCULAR | Status: DC | PRN
Start: 2020-12-17 — End: 2020-12-17
  Administered 2020-12-17: 17:00:00 via INTRAVENOUS

## 2020-12-17 MED ORDER — METHYLENE BLUE (ANTIDOTE) 1 % IJ SOLN
1 % (0 mg/mL) | INTRAVENOUS | Status: DC | PRN
Start: 2020-12-17 — End: 2020-12-17
  Administered 2020-12-17: 16:00:00 via SUBCUTANEOUS

## 2020-12-17 MED ORDER — ONDANSETRON (PF) 4 MG/2 ML INJECTION
4 mg/2 mL | INTRAMUSCULAR | Status: DC | PRN
Start: 2020-12-17 — End: 2020-12-17
  Administered 2020-12-17: 17:00:00 via INTRAVENOUS

## 2020-12-17 MED ORDER — HYDROMORPHONE (PF) 2 MG/ML IJ SOLN
2 mg/mL | INTRAMUSCULAR | Status: DC | PRN
Start: 2020-12-17 — End: 2020-12-17
  Administered 2020-12-17 (×4): via INTRAVENOUS

## 2020-12-17 MED ORDER — FENTANYL CITRATE (PF) 50 MCG/ML IJ SOLN
50 mcg/mL | INTRAMUSCULAR | Status: DC | PRN
Start: 2020-12-17 — End: 2020-12-17

## 2020-12-17 MED ORDER — SODIUM CHLORIDE 0.9 % IV
INTRAVENOUS | Status: DC
Start: 2020-12-17 — End: 2020-12-17

## 2020-12-17 MED ORDER — ACETAMINOPHEN 325 MG TABLET
325 mg | Freq: Four times a day (QID) | ORAL | Status: AC
Start: 2020-12-17 — End: 2020-12-18
  Administered 2020-12-17 – 2020-12-18 (×3): via ORAL

## 2020-12-17 MED ORDER — MIDAZOLAM 1 MG/ML IJ SOLN
1 mg/mL | INTRAMUSCULAR | Status: DC | PRN
Start: 2020-12-17 — End: 2020-12-17

## 2020-12-17 MED ORDER — PROPOFOL 10 MG/ML IV EMUL
10 mg/mL | INTRAVENOUS | Status: DC | PRN
Start: 2020-12-17 — End: 2020-12-17
  Administered 2020-12-17: 14:00:00 via INTRAVENOUS

## 2020-12-17 MED ORDER — HYDROMORPHONE 1 MG/ML INJECTION SOLUTION
1 mg/mL | INTRAMUSCULAR | Status: DC | PRN
Start: 2020-12-17 — End: 2020-12-17

## 2020-12-17 MED ORDER — LIDOCAINE (PF) 10 MG/ML (1 %) IJ SOLN
10 mg/mL (1 %) | INTRAMUSCULAR | Status: DC | PRN
Start: 2020-12-17 — End: 2020-12-17

## 2020-12-17 MED ORDER — SCOPOLAMINE (1.3-1.5) MG 72 HR TRANSDERM PATCH
1 mg over 3 days | TRANSDERMAL | Status: AC
Start: 2020-12-17 — End: ?

## 2020-12-17 MED ORDER — ACETAMINOPHEN 325 MG TABLET
325 mg | Freq: Once | ORAL | Status: AC
Start: 2020-12-17 — End: 2020-12-17
  Administered 2020-12-17: 14:00:00 via ORAL

## 2020-12-17 MED ORDER — DEXAMETHASONE SODIUM PHOSPHATE 4 MG/ML IJ SOLN
4 mg/mL | INTRAMUSCULAR | Status: DC | PRN
Start: 2020-12-17 — End: 2020-12-17
  Administered 2020-12-17: 15:00:00 via INTRAVENOUS

## 2020-12-17 MED ORDER — NEOSTIGMINE METHYLSULFATE 1 MG/ML INTRAVENOUS SOLUTION
1 mg/mL | INTRAVENOUS | Status: DC | PRN
Start: 2020-12-17 — End: 2020-12-17
  Administered 2020-12-17: 17:00:00 via INTRAVENOUS

## 2020-12-17 MED ORDER — LIDOCAINE (PF) 20 MG/ML (2 %) IJ SOLN
20 mg/mL (2 %) | INTRAMUSCULAR | Status: DC | PRN
Start: 2020-12-17 — End: 2020-12-17
  Administered 2020-12-17: 14:00:00 via INTRAVENOUS

## 2020-12-17 MED ORDER — HYDROMORPHONE 2 MG/ML INJECTION SOLUTION
2 mg/mL | INTRAMUSCULAR | Status: AC
Start: 2020-12-17 — End: ?

## 2020-12-17 MED ORDER — LACTATED RINGERS IV
INTRAVENOUS | Status: DC | PRN
Start: 2020-12-17 — End: 2020-12-17
  Administered 2020-12-17 (×2): via INTRAVENOUS

## 2020-12-17 MED ORDER — ONDANSETRON (PF) 4 MG/2 ML INJECTION
4 mg/2 mL | INTRAMUSCULAR | Status: DC | PRN
Start: 2020-12-17 — End: 2020-12-17
  Administered 2020-12-17: 15:00:00 via INTRAVENOUS

## 2020-12-17 MED ORDER — FENTANYL CITRATE (PF) 50 MCG/ML IJ SOLN
50 mcg/mL | INTRAMUSCULAR | Status: AC
Start: 2020-12-17 — End: ?

## 2020-12-17 MED ORDER — SIMETHICONE 80 MG CHEWABLE TAB
80 mg | Freq: Four times a day (QID) | ORAL | Status: AC | PRN
Start: 2020-12-17 — End: 2020-12-18
  Administered 2020-12-17 – 2020-12-18 (×3): via ORAL

## 2020-12-17 MED ORDER — LEVOFLOXACIN IN D5W 500 MG/100 ML IV PIGGY BACK
500 mg/100 mL | INTRAVENOUS | Status: AC
Start: 2020-12-17 — End: ?

## 2020-12-17 MED ORDER — DIPHENHYDRAMINE HCL 50 MG/ML IJ SOLN
50 mg/mL | INTRAMUSCULAR | Status: AC | PRN
Start: 2020-12-17 — End: 2020-12-18

## 2020-12-17 MED ORDER — SODIUM CHLORIDE 0.9 % IJ SYRG
INTRAMUSCULAR | Status: AC | PRN
Start: 2020-12-17 — End: 2020-12-18

## 2020-12-17 MED ORDER — MORPHINE 2 MG/ML INJECTION
2 mg/mL | INTRAMUSCULAR | Status: AC | PRN
Start: 2020-12-17 — End: 2020-12-18

## 2020-12-17 MED ORDER — MIDAZOLAM 1 MG/ML IJ SOLN
1 mg/mL | INTRAMUSCULAR | Status: DC | PRN
Start: 2020-12-17 — End: 2020-12-17
  Administered 2020-12-17: 14:00:00 via INTRAVENOUS

## 2020-12-17 MED ORDER — SODIUM CHLORIDE 0.9 % IJ SYRG
Freq: Three times a day (TID) | INTRAMUSCULAR | Status: AC
Start: 2020-12-17 — End: 2020-12-18
  Administered 2020-12-17 – 2020-12-18 (×3): via INTRAVENOUS

## 2020-12-17 MED ORDER — SCOPOLAMINE (1.3-1.5) MG 72 HR TRANSDERM PATCH
1 mg over 3 days | TRANSDERMAL | Status: DC | PRN
Start: 2020-12-17 — End: 2020-12-17
  Administered 2020-12-17: 14:00:00 via TRANSDERMAL

## 2020-12-17 MED ORDER — BUPIVACAINE (PF) 0.25 % (2.5 MG/ML) IJ SOLN
0.25 % (2.5 mg/mL) | INTRAMUSCULAR | Status: DC | PRN
Start: 2020-12-17 — End: 2020-12-17
  Administered 2020-12-17: 16:00:00 via SUBCUTANEOUS

## 2020-12-17 MED ORDER — MORPHINE 2 MG/ML INJECTION
2 mg/mL | INTRAMUSCULAR | Status: DC | PRN
Start: 2020-12-17 — End: 2020-12-17

## 2020-12-17 MED ORDER — METHYLENE BLUE (ANTIDOTE) 5 MG/ML (0.5 %) INTRAVENOUS SOLUTION
5 mg/mL | INTRAVENOUS | Status: AC
Start: 2020-12-17 — End: ?

## 2020-12-17 MED ORDER — MIDAZOLAM 1 MG/ML IJ SOLN
1 mg/mL | INTRAMUSCULAR | Status: AC
Start: 2020-12-17 — End: ?

## 2020-12-17 MED ORDER — ROPIVACAINE (PF) 5 MG/ML (0.5 %) INJECTION
5 mg/mL (0. %) | INTRAMUSCULAR | Status: DC | PRN
Start: 2020-12-17 — End: 2020-12-17

## 2020-12-17 MED ORDER — ROCURONIUM 10 MG/ML IV
10 mg/mL | INTRAVENOUS | Status: DC | PRN
Start: 2020-12-17 — End: 2020-12-17
  Administered 2020-12-17 (×4): via INTRAVENOUS

## 2020-12-17 MED ORDER — BUPIVACAINE (PF) 0.25 % (2.5 MG/ML) IJ SOLN
0.25 % (2.5 mg/mL) | INTRAMUSCULAR | Status: DC | PRN
Start: 2020-12-17 — End: 2020-12-17
  Administered 2020-12-17: 15:00:00 via SUBCUTANEOUS

## 2020-12-17 MED ORDER — DIPHENHYDRAMINE HCL 50 MG/ML IJ SOLN
50 mg/mL | INTRAMUSCULAR | Status: DC | PRN
Start: 2020-12-17 — End: 2020-12-17

## 2020-12-17 MED ORDER — SUCCINYLCHOLINE CHLORIDE 20 MG/ML INJECTION
20 mg/mL | INTRAMUSCULAR | Status: DC | PRN
Start: 2020-12-17 — End: 2020-12-17
  Administered 2020-12-17: 14:00:00 via INTRAVENOUS

## 2020-12-17 MED FILL — BUPIVACAINE (PF) 0.25 % (2.5 MG/ML) IJ SOLN: 0.25 % (2.5 mg/mL) | INTRAMUSCULAR | Qty: 30

## 2020-12-17 MED FILL — KETOROLAC TROMETHAMINE 30 MG/ML INJECTION: 30 mg/mL (1 mL) | INTRAMUSCULAR | Qty: 1

## 2020-12-17 MED FILL — ACETAMINOPHEN 325 MG TABLET: 325 mg | ORAL | Qty: 2

## 2020-12-17 MED FILL — PROVAYBLUE 5 MG/ML INTRAVENOUS SOLUTION: 5 mg/mL | INTRAVENOUS | Qty: 10

## 2020-12-17 MED FILL — SYNTHROID 25 MCG TABLET: 25 mcg | ORAL | Qty: 1

## 2020-12-17 MED FILL — BUSPIRONE 5 MG TAB: 5 mg | ORAL | Qty: 3

## 2020-12-17 MED FILL — ONDANSETRON (PF) 4 MG/2 ML INJECTION: 4 mg/2 mL | INTRAMUSCULAR | Qty: 2

## 2020-12-17 MED FILL — LEVOFLOXACIN IN D5W 500 MG/100 ML IV PIGGY BACK: 500 mg/100 mL | INTRAVENOUS | Qty: 100

## 2020-12-17 MED FILL — MIDAZOLAM 1 MG/ML IJ SOLN: 1 mg/mL | INTRAMUSCULAR | Qty: 2

## 2020-12-17 MED FILL — OXYCODONE-ACETAMINOPHEN 5 MG-325 MG TAB: 5-325 mg | ORAL | Qty: 1

## 2020-12-17 MED FILL — LACTATED RINGERS IV: INTRAVENOUS | Qty: 1000

## 2020-12-17 MED FILL — PROCHLORPERAZINE EDISYLATE 5 MG/ML INJECTION: 5 mg/mL | INTRAMUSCULAR | Qty: 2

## 2020-12-17 MED FILL — SODIUM CHLORIDE 0.9 % IV: INTRAVENOUS | Qty: 1000

## 2020-12-17 MED FILL — HYDROMORPHONE 2 MG/ML INJECTION SOLUTION: 2 mg/mL | INTRAMUSCULAR | Qty: 1

## 2020-12-17 MED FILL — SIMETHICONE 80 MG CHEWABLE TAB: 80 mg | ORAL | Qty: 1

## 2020-12-17 MED FILL — TRANSDERM-SCOP 1 MG OVER 3 DAYS TRANSDERMAL PATCH: 1 mg over 3 days | TRANSDERMAL | Qty: 1

## 2020-12-17 MED FILL — FENTANYL CITRATE (PF) 50 MCG/ML IJ SOLN: 50 mcg/mL | INTRAMUSCULAR | Qty: 2

## 2020-12-17 MED FILL — NORMAL SALINE FLUSH 0.9 % INJECTION SYRINGE: INTRAMUSCULAR | Qty: 40

## 2020-12-17 MED FILL — HYDROMORPHONE (PF) 1 MG/ML IJ SOLN: 1 mg/mL | INTRAMUSCULAR | Qty: 1

## 2020-12-17 MED FILL — MORPHINE 2 MG/ML INJECTION: 2 mg/mL | INTRAMUSCULAR | Qty: 1

## 2020-12-17 NOTE — Interval H&P Note (Signed)
Called husband, Caryn Bee, to confirm patient is returning to room 309

## 2020-12-17 NOTE — Op Note (Signed)
Brief Postoperative Note    Patient: Teresa Beasley  Date of Birth: 06/12/1980  MRN: [id number]    Date of Procedure: 12/17/2020     Pre-Op Diagnosis: Pelvic pain and  right adnexal mass    Post-Op Diagnosis: Same as preoperative diagnosis.      Procedure(s):  LAPAROSCOPIC RESECTION OF MASS, TOTAL LAPAROSCOPIC HYSTERECTOMY, RIGHT SALPINGOOOPHORECTOMY, LYSIS OF ADHESIONS, CYSTOSOCPY    Surgeon(s):  Annia Friendly, MD    Surgical Assistant: Surg Asst-1: Lennette Bihari    Anesthesia: General     Estimated Blood Loss (mL): Minimal    Complications: None    Specimens:   ID Type Source Tests Collected by Time Destination   1 : Uterus, Cervix, Right Fallopian Tube. Right Ovary, Freeze Right [place] Mass Frozen Section Other                  Annia Friendly, MD 12/17/2020 1156 Pathology        Implants: * No implants in log *    Drains: * No LDAs found *    Findings: Extensive adhesions of omentum to anterior abdominal wall.  Large right adnexal mass, consistent with a hemorrhagic cyst.  Normal uterus.  Absent left tube and ovary.  Rectum adherent to posterior aspect of uterus and cervix.  Right adnexa densely adherent to right posterior aspect of uterus.  Dilated right ureter.  Frozen section pathology demonstrated a benign hemorrhagic cyst.  Cystoscopy demonstrated flow of urine from the both ureters.      Electronically Signed by Annia Friendly, MD on 12/17/2020 at 12:20 PM

## 2020-12-17 NOTE — Anesthesia Post-Procedure Evaluation (Signed)
Procedure(s):  LAPAROSCOPIC RESECTION OF MASS, TOTAL LAPAROSCOPIC HYSTERECTOMY, RIGHT SALPIGOOOPHORECTOMY. LYSIS OF ADHESIONS.    general    Anesthesia Post Evaluation        Patient location during evaluation: PACU  Patient participation: complete - patient participated  Level of consciousness: awake and alert  Pain management: adequate  Airway patency: patent  Anesthetic complications: no  Cardiovascular status: acceptable  Respiratory status: acceptable  Hydration status: acceptable  Comments: I have seen and evaluated the patient and is ready for discharge. Chyrel Masson, MD    Post anesthesia nausea and vomiting:  none      INITIAL Post-op Vital signs:   Vitals Value Taken Time   BP 145/80 12/17/20 1315   Temp 36.6 ??C (97.8 ??F) 12/17/20 1300   Pulse 85 12/17/20 1324   Resp 12 12/17/20 1324   SpO2 97 % 12/17/20 1324   Vitals shown include unvalidated device data.

## 2020-12-17 NOTE — Interval H&P Note (Signed)
 TRANSFER - OUT REPORT:    Verbal report given to  [facility]  on Alexia Dinger  being returned to room 309 for routine progression of care       Report consisted of patient's Situation, Background, Assessment and   Recommendations(SBAR).     Time Pre op antibiotic given:1033  Anesthesia Stop time: 1301    Information from the following report(s) Procedure Summary, Intake/Output, and MAR was reviewed with the receiving nurse.    Opportunity for questions and clarification was provided.     Is the patient on 02? NO       L/Min        Other     Is the patient on a monitor? NO    Is the nurse transporting with the patient? NO    Surgical Waiting Area notified of patient's transfer from PACU? YES      The following personal items collected during your admission accompanied patient upon transfer:   Dental Appliance: Dental Appliances: None  Vision:    Hearing Aid:    Jewelry: Jewelry: None  Clothing: Clothing: At bedside (Returned in PACU)  Other Valuables: Other Valuables: Cell Phone, With patient  Valuables sent to safe:

## 2020-12-17 NOTE — Anesthesia Pre-Procedure Evaluation (Signed)
Relevant Problems   NEUROLOGY   (+) Generalized anxiety disorder      CARDIOVASCULAR   (+) Essential (primary) hypertension      GASTROINTESTINAL   (+) GERD (gastroesophageal reflux disease)      ENDOCRINE   (+) Acquired hypothyroidism       Anesthetic History   No history of anesthetic complications            Review of Systems / Medical History  Patient summary reviewed, nursing notes reviewed and pertinent labs reviewed    Pulmonary  Within defined limits                 Neuro/Psych   Within defined limits           Cardiovascular    Hypertension          Hyperlipidemia    Exercise tolerance: >4 METS     GI/Hepatic/Renal     GERD           Endo/Other      Hypothyroidism: well controlled  Obesity     Other Findings              Physical Exam    Airway  Mallampati: II  TM Distance: 4 - 6 cm  Neck ROM: normal range of motion   Mouth opening: Normal     Cardiovascular  Regular rate and rhythm,  S1 and S2 normal,  no murmur, click, rub, or gallop             Dental  No notable dental hx       Pulmonary  Breath sounds clear to auscultation               Abdominal  GI exam deferred       Other Findings            Anesthetic Plan    ASA: 2  Anesthesia type: general          Induction: Intravenous  Anesthetic plan and risks discussed with: Patient

## 2020-12-17 NOTE — Interval H&P Note (Signed)
Called husband, Caryn Bee, to update.   Will call back to confirm room assignment.   (Patient was previously in room 309- Currently assigned to room 305)

## 2020-12-17 NOTE — Progress Notes (Signed)
12/17/20 1055   Urinary Catheter 12/17/20 Foley   Placement Date/Time: 12/17/20 1038   Inserted By: Dr. Sheppard Plumber  Type: Foley  Catheter Size: 16 FR  Balloon Size: 10 ml   Indications for Use Perioperative use for selected surgical procedures   Status Patent   Site Condition No abnormalities   Bag Below Bladder/Not on Floor Yes   Lack of Dependent Loop in Tubing Yes

## 2020-12-17 NOTE — Interval H&P Note (Signed)
SURGICEL GIVEN TO STERILE FIELD FOR PRN USE BY SURGEON  REF [id number]  LOT shbhar  EXP 03/23/22

## 2020-12-17 NOTE — Progress Notes (Signed)
12/17/20 1049   Family Communication   Family Update Message Other (Comment)   Delivery Origin Nurse   Contact Person Phone Number Caryn Bee [phone]   Family/Significant Other Update Other (Comment)     Unable to reach for update.

## 2020-12-17 NOTE — Op Note (Signed)
Gynecologic Oncology Operative Report    Ruta Capece  12/17/2020    Pre-operative dx:  Right pelvic mass, pelvic pain, abdominal pain    Post-operative dx:  Right pelvic mass, pelvic pain, abdominal pain    Procedure:  Laparoscopic resection of mass, total laparoscopic hysterectomy, right salpingooophorectomy, lysis of adhesions cystoscopy    Surgeon:  Annia Friendly, MD    Assistant:  Lennette Bihari, SA     Anesthesia:  GETA    EBL:  25 cc    Complications:  None    Specimens:    ID Type Source Tests Collected by Time Destination   1 : Uterus, Cervix, Right Fallopian Tube. Right Ovary, Freeze Right [place] Mass Frozen Section Other                  Annia Friendly, MD 12/17/2020 1156 Pathology     Implants:  None    Operative indications:  40 yo WF admitted due to a pelvic mass and pain.  She was recommended surgical resection.       Operative findings:   Extensive adhesions of omentum to anterior abdominal wall.  Large right adnexal mass, consistent with a hemorrhagic cyst.  Normal uterus.  Absent left tube and ovary.  Rectum adherent to posterior aspect of uterus and cervix.  Right adnexa densely adherent to right posterior aspect of uterus.  Dilated right ureter.  Frozen section pathology demonstrated a benign hemorrhagic cyst.  Cystoscopy demonstrated flow of urine from the both ureters.       Procedure in detail:  After the risks, benefits, indications, and alternatives of the procedure were discussed with the patient and informed consent was obtained, the patient was taken to the operating room.  She was positively identified, administered general anesthesia, and then placed in the dorsal lithotomy position in [place] stirrups.  An exam under anesthesia was performed.  She was then prepped and draped in the usual fashion.  A foley catheter was inserted, followed by a V-Care uterine manipulator.  We then proceeded with laparoscopy.  I opted to utilize the left upper quadrant incision from her recent robotic  cholecystectomy as my initial trocar insertion.  A small incision was made with an #11-blade scalpel in the old incision.  A 5 mm blade-less trocar was then directly inserted, with the camera in position to visualize safe entry.  Once proper placement was confirmed, the abdomen was then insufflated with carbon dioxide.  The above mentioned findings were noted of omental adhesions to the anterior abdominal wall from the umbilicus down.  We placed an additional 5 mm trocar in the upper abdomen in the midline under direct visualization.  Using these two trocar sites, I was able to free the omentum from the anterior abdominal wall.  Once the omentum was completely freed, we were then able to place two additional 5 mm trocars.  I again utilized her prior scars, one just above the umbilicus and one in the right abdomen.  These trocars were inserted under direct visualization to ensure safe entry.  The patient was then placed in Trendelenburg tilt and we then proceeded with the hysterectomy.  We started on the right side, incising the peritoneum lateral to the large right adnexal mass, allowing entry into the retroperitoneal space.   The right round ligament was coagulated and transected with the Harmonic Scalpel.  We then identified the right ureter and bluntly developed the perirectal space.  The left uterine artery was identified at its origin off of  the hypogastric artery, and it was coagulated and transected with the Harmonic Scalpel at that point, with the ureter being held on traction medially to ensure its safety.  The left ovarian vessels were then isolated and then coagulated and transected with the Harmonic Scalpel.  The posterior leaf of the broad ligament was then divided with the Harmonic Scalpel to the level of the uterine body.  We then began to free the adhesions of the rectum and the right adnexal mass from the posterior uterus.  This was accomplished with a combination of sharp and blunt dissection,  using both Endoshears and the Harmonic Scalpel.  During this portion of the procedure the mass was ruptured, draining what appeared to be old blood.  Once the adnexa and rectum were free posteriorly we then moved anteriorly and divided the vesicouterine peritoneum in order to visualize the V-Care ring.  The uterine arteries were then skeletonized bilaterally and then coagulated and transected with the Harmonic Scalpel at the level of the V-Care ring.  A circumferential colpotomy was then performed by using the active blade of the Harmonic Scalpel to cut down onto the V-Care ring.  Once the colpotomy was completed, the specimen was delivered transvaginally and sent to pathology.  A bulb syringe was then placed into the vagina to maintain pneumoperitoneum for vaginal cuff closure.  The cuff was then reapproximated with three figure-of-eight stitches of 2-0 PDS suture using the LSI RD-180 suturing device.  The sutures were secured in place with the LSI Ti-Knot device.  Excellent reapproximation and hemostasis was noted.     We then performed a cystoscopy to confirm ureteral integrity while awaiting the frozen section pathology.  The bulb syringe was removed from the vagina.  The foley was removed.  A 70 degree diagnostic cystoscope was inserted and the bladder was filled with sterile water.  Prior to the cystoscopy anesthesia administered IV methylene blue.  The bladder was inspected and the ureteral orifices were identified.  Urine could be seen flowing from both ureters.  The scope was then removed and the foley replaced.      We then looked again laparoscopically.  The pelvis was then irrigated and all pedicles inspected.  Surgicel Powder was applied to the areas of dissection.  Once satisfied with hemostasis, the gas was then allowed to escape from the abdomen and the trocars were removed.  She was then taken out of Trendelenburg tilt.  The incision sites were closed with a stitch of 4-0 Monocryl, followed by a  layer of Dermabond.  The bulb syringe was then removed from the vagina.  The patient was taken out of stirrups, awakened from anesthesia, and taken to the recovery room in stable condition.  All instrument, sponge, and needle counts were correct.        Annia Friendly, MD  12/17/2020  12:25 PM

## 2020-12-17 NOTE — Progress Notes (Signed)
12/17/20 1109   Bowel Bundle Checklist   Wound Protector Used? N/A   Closing instruments isolated and used for closing fascia and skin? N/A   Sterile glove change by all team members prior to close? N/A   Gown changed by all team members prior to close? N/A

## 2020-12-17 NOTE — Interval H&P Note (Signed)
 TRANSFER - IN REPORT:    Verbal report received from [facility] RN(name) on Teresa Beasley  being received from 3E(unit) for ordered procedure      Report consisted of patient's Situation, Background, Assessment and   Recommendations(SBAR).     Information from the following report(s) SBAR and Kardex was reviewed with the receiving nurse.    Opportunity for questions and clarification was provided.      Assessment completed upon patient's arrival to unit and care assumed.

## 2020-12-17 NOTE — Progress Notes (Signed)
12/17/20 1113   Family Communication   Family Update Message Surgeon working   Delivery Origin Surgeon   Contact Person Relationship to Patient Spouse   Contact Person Phone Number [place] [phone]   Family/Significant Other Update Called

## 2020-12-17 NOTE — Progress Notes (Signed)
[facility] GYNECOLOGIC ONCOLOGY  [address] [place] [address]  P [phone]   F [phone]       Patient Name: Teresa Beasley   Admit Date: 12/15/2020   OR Date: 12/17/2020   Visit Date: 12/17/2020        SUBJECTIVE:    Reports a little more pain overnight.    OBJECTIVE:    Patient Vitals for the past 24 hrs:   Temp Pulse Resp BP SpO2   12/17/20 0905 98.5 ??F (36.9 ??C) 86 16 (!) 153/84 97 %   12/17/20 0403 98.2 ??F (36.8 ??C) 89 18 117/66 96 %   12/16/20 2000 99.8 ??F (37.7 ??C) 90 15 117/71 97 %   12/16/20 1530 98.8 ??F (37.1 ??C) 91 15 117/73 99 %             Physical Exam     General:  alert, cooperative, no distress     Cardiac:  Regular rate and rhythm        Lungs:  clear to auscultation bilaterally  Abdomen:  Soft, ND, mildly tender in RLQ.       Wound:   n/a   Extremity: extremities normal, atraumatic, no cyanosis or edema    Data Review  Lab Results   Component Value Date/Time    WBC 11.9 (H) 12/15/2020 05:42 AM    ABS. NEUTROPHILS 9.0 (H) 12/15/2020 05:42 AM    HGB 8.3 (L) 12/15/2020 05:42 AM    HCT 27.4 (L) 12/15/2020 05:42 AM    MCV 78.7 (L) 12/15/2020 05:42 AM    MCH 23.9 (L) 12/15/2020 05:42 AM    PLATELET 327 12/15/2020 05:42 AM     Lab Results   Component Value Date/Time    Sodium 140 12/15/2020 05:42 AM    Potassium 3.4 (L) 12/15/2020 05:42 AM    Chloride 111 (H) 12/15/2020 05:42 AM    CO2 23 12/15/2020 05:42 AM    Glucose 104 (H) 12/15/2020 05:42 AM    BUN 7 12/15/2020 05:42 AM    Creatinine 0.67 12/15/2020 05:42 AM    Calcium 8.4 (L) 12/15/2020 05:42 AM    Albumin 3.0 (L) 12/15/2020 05:42 AM    Bilirubin, total 0.2 12/15/2020 05:42 AM    ALT (SGPT) 14 12/15/2020 05:42 AM    Alk. phosphatase 91 12/15/2020 05:42 AM       IMPRESSION/PLAN:    Gynecologic:  Complex right adnexal mass.  This is most likely a benign process.  I suspect a hemorrhagic cyst, possibly an endometrioma.  Plan is to take to the OR today for resection.  We will also plan to perform a hysterectomy at the same  time.    Heme/CV:  Hemodynamically stable.    Renal:  Good UOP.  Normal creatinine.       FEN/GI:  Bowel prep yesterday.       ID/Wound:  Afebrile.  UTI noted on admission.  Currently on Levaquin.    PPX:  Ambulation.     Dispostion:  To the OR today for resection.  If we are able to perform the procedure laparoscopically, she should be able to go home tomorrow.          Albertha Ghee, MD  12/17/2020  9:10 AM       Date of Surgery Update  Teresa Beasley was seen and examined.  History and physical has been reviewed. The patient has been examined. There have been no significant clinical changes since the  completion of the originally dated History and Physical.  Patient identified by surgeon; surgical site was confirmed by patient and surgeon.  Planned procedure discussed once again.  Questions answered.    Consent signed.  Patient wishes to proceed as planned.    Albertha Ghee, MD  December 17, 2020  9:21 AM

## 2020-12-17 NOTE — Progress Notes (Signed)
 Bedside and Verbal shift change report given to M. Tajuddin, Charity fundraiser (Cabin crew) by MARLA. Kirt, RN (offgoing nurse). Report included the following information SBAR, Kardex, ED Summary, Procedure Summary, Intake/Output, MAR, Accordion, and Recent Results.      26: TRANSFER - OUT REPORT:    Verbal report given to S. Lenon, RN (name) on Teresa Beasley  being transferred to PreOp(unit) for ordered procedure       Report consisted of patient's Situation, Background, Assessment and   Recommendations(SBAR).     Information from the following report(s) SBAR, Kardex, ED Summary, Procedure Summary, Intake/Output, MAR, Accordion, and Recent Results was reviewed with the receiving nurse.    Lines:   Peripheral IV 12/15/20 Anterior;Left;Proximal Forearm (Active)   Site Assessment Clean, dry, & intact 12/17/20 0807   Phlebitis Assessment 0 12/17/20 0807   Infiltration Assessment 0 12/17/20 0807   Dressing Status Clean, dry, & intact 12/17/20 0807   Dressing Type Transparent 12/17/20 0807   Hub Color/Line Status Infusing 12/17/20 9192   Action Taken Open ports on tubing capped 12/17/20 0807   Alcohol Cap Used Yes 12/17/20 9192        Opportunity for questions and clarification was provided.      Patient transported with:  OR tech    1400- TRANSFER - IN REPORT:    Verbal report received from DOROTHA Portugal, RN (name) on Teresa Beasley  being received from PACU (unit) for routine post - op      Report consisted of patient's Situation, Background, Assessment and   Recommendations(SBAR).     Information from the following report(s) SBAR, Kardex, OR Summary, Procedure Summary, Intake/Output, MAR, Accordion, and Recent Results was reviewed with the receiving nurse.    Opportunity for questions and clarification was provided.      Assessment completed upon patient's arrival to unit and care assumed.     [id number]- Patient requesting foley to be removed.     1730- Therisa called and notified of patient's request. Okay to d/c    1740- Notified  patient but patient stated she wanted to wait for a little bit longer.     1825- Patient now wants foley to be removed.

## 2020-12-17 NOTE — Progress Notes (Signed)
Bedside and Verbal shift change report given to K. Deleo RN (oncoming nurse) by M. Tajuddin RN (offgoing nurse). Report included the following information SBAR, Kardex, and OR Summary.

## 2020-12-18 LAB — CBC
Hematocrit: 23.9 % — ABNORMAL LOW (ref 35.0–47.0)
Hemoglobin: 7.5 g/dL — ABNORMAL LOW (ref 11.5–16.0)
MCH: 24.4 PG — ABNORMAL LOW (ref 26.0–34.0)
MCHC: 31.4 g/dL (ref 30.0–36.5)
MCV: 77.6 FL — ABNORMAL LOW (ref 80.0–99.0)
MPV: 10.2 FL (ref 8.9–12.9)
NRBC Absolute: 0 10*3/uL (ref 0.00–0.01)
Nucleated RBCs: 0 PER 100 WBC
Platelets: 321 10*3/uL (ref 150–400)
RBC: 3.08 M/uL — ABNORMAL LOW (ref 3.80–5.20)
RDW: 14.8 % — ABNORMAL HIGH (ref 11.5–14.5)
WBC: 13.3 10*3/uL — ABNORMAL HIGH (ref 3.6–11.0)

## 2020-12-18 LAB — BASIC METABOLIC PANEL
Anion Gap: 6 mmol/L (ref 5–15)
BUN: 6 MG/DL (ref 6–20)
Bun/Cre Ratio: 9 — ABNORMAL LOW (ref 12–20)
CO2: 22 mmol/L (ref 21–32)
Calcium: 8.2 MG/DL — ABNORMAL LOW (ref 8.5–10.1)
Chloride: 110 mmol/L — ABNORMAL HIGH (ref 97–108)
Creatinine: 0.65 MG/DL (ref 0.55–1.02)
EGFR IF NonAfrican American: >60 mL/min/{1.73_m2} (ref >60)
GFR African American: >60 mL/min/{1.73_m2} (ref >60)
Glucose: 113 mg/dL — ABNORMAL HIGH (ref 65–100)
Potassium: 3.5 mmol/L (ref 3.5–5.1)
Sodium: 138 mmol/L (ref 136–145)

## 2020-12-18 LAB — CBC W/O DIFF
ABSOLUTE NRBC: 0 10*3/uL (ref 0.00–0.01)
HCT: 23.9 % — ABNORMAL LOW (ref 35.0–47.0)
HGB: 7.5 g/dL — ABNORMAL LOW (ref 11.5–16.0)
MCH: 24.4 PG — ABNORMAL LOW (ref 26.0–34.0)
MCHC: 31.4 g/dL (ref 30.0–36.5)
MCV: 77.6 FL — ABNORMAL LOW (ref 80.0–99.0)
MPV: 10.2 FL (ref 8.9–12.9)
NRBC: 0 PER 100 WBC
PLATELET: 321 10*3/uL (ref 150–400)
RBC: 3.08 M/uL — ABNORMAL LOW (ref 3.80–5.20)
RDW: 14.8 % — ABNORMAL HIGH (ref 11.5–14.5)
WBC: 13.3 10*3/uL — ABNORMAL HIGH (ref 3.6–11.0)

## 2020-12-18 LAB — METABOLIC PANEL, BASIC
Anion gap: 6 mmol/L (ref 5–15)
BUN/Creatinine ratio: 9 — ABNORMAL LOW (ref 12–20)
BUN: 6 MG/DL (ref 6–20)
CO2: 22 mmol/L (ref 21–32)
Calcium: 8.2 MG/DL — ABNORMAL LOW (ref 8.5–10.1)
Chloride: 110 mmol/L — ABNORMAL HIGH (ref 97–108)
Creatinine: 0.65 MG/DL (ref 0.55–1.02)
GFR est AA: >60 mL/min/{1.73_m2} (ref >60)
GFR est non-AA: >60 mL/min/{1.73_m2} (ref >60)
Glucose: 113 mg/dL — ABNORMAL HIGH (ref 65–100)
Potassium: 3.5 mmol/L (ref 3.5–5.1)
Sodium: 138 mmol/L (ref 136–145)

## 2020-12-18 MED ORDER — IBUPROFEN 800 MG TAB
800 mg | ORAL_TABLET | Freq: Four times a day (QID) | ORAL | 0 refills | Status: AC | PRN
Start: 2020-12-18 — End: 2021-05-17

## 2020-12-18 MED ORDER — OXYCODONE-ACETAMINOPHEN 5 MG-325 MG TAB
5-325 mg | ORAL_TABLET | Freq: Four times a day (QID) | ORAL | 0 refills | Status: DC | PRN
Start: 2020-12-18 — End: 2020-12-18

## 2020-12-18 MED ORDER — CIPROFLOXACIN 250 MG TAB
250 mg | ORAL_TABLET | Freq: Two times a day (BID) | ORAL | 0 refills | Status: AC
Start: 2020-12-18 — End: 2021-05-17

## 2020-12-18 MED ORDER — DOCUSATE SODIUM 100 MG CAP
100 mg | ORAL_CAPSULE | Freq: Every day | ORAL | 0 refills | Status: AC
Start: 2020-12-18 — End: 2021-01-17

## 2020-12-18 MED ORDER — CONJUGATED ESTROGENS 0.625 MG TAB
0.625 mg | ORAL_TABLET | Freq: Every day | ORAL | 2 refills | Status: DC
Start: 2020-12-18 — End: 2021-03-11

## 2020-12-18 MED ORDER — OXYCODONE-ACETAMINOPHEN 5 MG-325 MG TAB
5-325 mg | ORAL_TABLET | Freq: Four times a day (QID) | ORAL | 0 refills | Status: AC | PRN
Start: 2020-12-18 — End: 2020-12-23

## 2020-12-18 MED FILL — SIMETHICONE 80 MG CHEWABLE TAB: 80 mg | ORAL | Qty: 1

## 2020-12-18 MED FILL — ACETAMINOPHEN 325 MG TABLET: 325 mg | ORAL | Qty: 2

## 2020-12-18 MED FILL — LIPITOR 20 MG TABLET: 20 mg | ORAL | Qty: 1

## 2020-12-18 MED FILL — PANTOPRAZOLE 20 MG TAB, DELAYED RELEASE: 20 mg | ORAL | Qty: 1

## 2020-12-18 MED FILL — OXYCODONE-ACETAMINOPHEN 5 MG-325 MG TAB: 5-325 mg | ORAL | Qty: 1

## 2020-12-18 MED FILL — LEVOFLOXACIN IN D5W 500 MG/100 ML IV PIGGY BACK: 500 mg/100 mL | INTRAVENOUS | Qty: 100

## 2020-12-18 MED FILL — BUSPIRONE 5 MG TAB: 5 mg | ORAL | Qty: 3

## 2020-12-18 MED FILL — KETOROLAC TROMETHAMINE 30 MG/ML INJECTION: 30 mg/mL (1 mL) | INTRAMUSCULAR | Qty: 1

## 2020-12-18 MED FILL — SYNTHROID 25 MCG TABLET: 25 mcg | ORAL | Qty: 1

## 2020-12-18 MED FILL — HYDROCHLOROTHIAZIDE 25 MG TAB: 25 mg | ORAL | Qty: 1

## 2020-12-18 NOTE — Progress Notes (Signed)
Bedside and Verbal shift change report given to [employer] (Cabin crew) by Florentina Addison (offgoing nurse). Report included the following information SBAR, Kardex, MAR, Accordion, and Recent Results.      I have reviewed discharge instructions with the patient and spouse.  The patient and spouse verbalized understanding.

## 2020-12-18 NOTE — Progress Notes (Signed)
A Spiritual Care Partner Volunteer visited patient in Room 309 on 12/18/2020.  Documented by:  Clancy Gourd, Chaplain, MDiv, MS, [facility]

## 2020-12-18 NOTE — Discharge Summary (Signed)
[facility]  A department of [facility]  [address] [place] [address]  [phone]        Discharge Summary  Patient ID:  Teresa Beasley  [id number]  40 y.o.  23-Dec-1980    Admit date: 12/15/2020    PCP: Karolee Stamps, MD    Admit MD: Albertha Ghee, MD    Discharge MD: same    Discharge date and time: 12/18/2020 11:10 AM    Admission Diagnoses:     Pelvic mass [R19.00]  Patient Active Problem List   Diagnosis Code    Obesity (BMI 30-39.9) E66.9    Gastro-esophageal reflux disease with esophagitis K21.00    Generalized anxiety disorder F41.1    Acquired hypothyroidism E03.9    Otalgia of right ear H92.01    URI (upper respiratory infection) J06.9    Ear problems H93.90    GERD (gastroesophageal reflux disease) K21.9    Hypernatremia E87.0    Anxiety F41.9    Essential (primary) hypertension I10    Mixed hyperlipidemia E78.2    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 LYSIS OF ADHESIONS    Hospital Course:   Ms. Lillibridge is a pleasant 40 year old premenopausal female who presented to the [facility] freestanding ER  on 12/15/2020 with complaints of abdominal pain and nausea.  CT scan of the abdomen/pelvis shows a right complex, cystic mass.  She was medically optimized and underwent the above mentioned procedure on 12/17/2020.      See op note for details, but briefly:  Extensive adhesions of omentum to anterior abdominal wall.  Large right adnexal mass, consistent with a hemorrhagic cyst.  Normal uterus.  Absent left tube and ovary.  Rectum adherent to posterior aspect of uterus and cervix.  Right adnexa densely adherent to right posterior aspect of uterus.  Dilated right ureter.  Cystoscopy  demonstrated flow of urine from the both ureters.       Frozen section pathology demonstrated a benign hemorrhagic cyst.   and final path returned as below.  She convalesced postop without complication with diet advanced as GI function returned, voiding, ambulating and pain controlled. She was deemed ready for release on POD 1 afebrile with a benign abd and wound. Followup, meds and education provided as below.    Pathology: * * *FINAL PATHOLOGIC DIAGNOSIS* * *     <<<<<       Uterus, cervix, right fallopian tube and right ovary; total   hysterectomy and right salpingo-oophorectomy:        Uterus and cervix:   Benign inactive endometrium.   Benign myometrium with small intramural leiomyoma (0.5 cm).   Ecto-and endocervical mucosa with immature squamous metaplasia and   microglandular hyperplasia; (see comment).     Right fallopian tube and ovary:        Benign fallopian with acute and chronic inflammation, mucosal   endometrialization, reactive/reparative epithelial changes and serosal   adhesions with involvement of endometriosis.        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     Consults: None    Significant Diagnostic Studies:  Lab Results  Component Value Date/Time    WBC 13.3 (H) 12/18/2020 05:11 AM    ABS. NEUTROPHILS 9.0 (H) 12/15/2020 05:42 AM    HGB 7.5 (L) 12/18/2020 05:11 AM    HCT 23.9 (L) 12/18/2020 05:11 AM    MCV 77.6 (L) 12/18/2020 05:11 AM    MCH 24.4 (L) 12/18/2020 05:11 AM    PLATELET 321 12/18/2020 05:11 AM     Lab Results   Component Value Date/Time    Sodium 138 12/18/2020 05:11 AM    Potassium 3.5 12/18/2020 05:11 AM    Chloride 110 (H) 12/18/2020 05:11 AM    CO2 22 12/18/2020 05:11 AM    Glucose 113 (H) 12/18/2020 05:11 AM    BUN 6 12/18/2020 05:11 AM    Creatinine 0.65 12/18/2020 05:11 AM    Calcium 8.2 (L) 12/18/2020 05:11 AM    Albumin 3.0 (L) 12/15/2020 05:42 AM    Bilirubin, total 0.2 12/15/2020 05:42 AM    ALT (SGPT) 14 12/15/2020 05:42  AM    Alk. phosphatase 91 12/15/2020 05:42 AM       Disposition: home    Patient Instructions:   Discharge Medication List as of 12/18/2020 10:15 AM        START taking these medications    Details   ibuprofen (MOTRIN) 800 mg tablet Take 1 Tablet by mouth every six (6) hours as needed for Pain., Normal, Disp-40 Tablet, R-0      conjugated estrogens (PREMARIN) 0.625 mg tablet Take 1 Tablet by mouth daily for 90 days., Normal, Disp-30 Tablet, R-2      docusate sodium (COLACE) 100 mg capsule Take 1 Capsule by mouth daily for 30 days. Stop taking if you develop loose stools, Normal, Disp-30 Capsule, R-0      ciprofloxacin HCl (CIPRO) 250 mg tablet Take 1 Tablet by mouth two (2) times a day., Normal, Disp-3 Tablet, R-0      oxyCODONE-acetaminophen (PERCOCET) 5-325 mg per tablet Take 1 Tablet by mouth every six (6) hours as needed for Pain for up to 5 days. Max Daily Amount: 4 Tablets., Normal, Disp-20 Tablet, R-0           CONTINUE these medications which have NOT CHANGED    Details   busPIRone (BUSPAR) 15 mg tablet TAKE 1 TABLET BY MOUTH EVERY DAY AS NEEDED FOR ANXIETY, Normal, Disp-30 Tablet, R-2      polyethylene glycol (MIRALAX) 17 gram packet Take 1 Packet by mouth daily. May take 1-2 packets as needed daily for constipation., Normal, Disp-14 Packet, R-1      levothyroxine (SYNTHROID) 25 mcg tablet TAKE 1 TABLET BY MOUTH EVERY DAY BEFORE BREAKFAST, Normal, Disp-90 Tablet, R-0      atorvastatin (LIPITOR) 20 mg tablet TAKE 1 TABLET BY MOUTH EVERY DAY, Normal, Disp-90 Tablet, R-0      lisinopril-hydroCHLOROthiazide (PRINZIDE, ZESTORETIC) 20-12.5 mg per tablet TAKE 1 TABLET BY MOUTH EVERY DAY, Normal, Disp-90 Tablet, R-0      omeprazole (PRILOSEC) 40 mg capsule Take 40 mg by mouth every morning., Historical Med           STOP taking these medications       ethinyl estradiol-etonogestrel (NUVARING) 0.12-0.015 mg/24 hr vaginal ring Comments:   Reason for Stopping:             Activity: no driving for 2 weeks and/or while  on analgesics, no heavy lifting for 6 weeks.  Nothing per vagina for 8 weeks  Walk four or more times daily  Showers okay,  no tub bathing for 4 weeks if surgery  Stool softner for constipation  Diet: Regular Diet and Encourage fluids  Wound Care: Keep wound clean and dry, Reinforce dressing PRN, and Ice to area for comfort    Follow-up with Dr. Mathews Robinsons in 4 weeks.    Signed:  Oneta Rack, PA-C  12/28/2020/12:54 PM

## 2020-12-18 NOTE — Progress Notes (Signed)
[facility]  [address], [place] [address]  P [phone]   F [phone]       Patient Name: Teresa Beasley   Admit Date: 12/15/2020   OR Date: 12/17/2020   Visit Date: 12/18/2020        SUBJECTIVE:  Feeling well this morning. Complains of gas pain.   No nausea. Tolerated regular diet.   Foley removed last night.  Has been able to void.     OBJECTIVE:    Patient Vitals for the past 24 hrs:   Temp Pulse Resp BP SpO2   12/18/20 0820 98.1 ??F (36.7 ??C) 99 16 136/85 97 %   12/18/20 0505 97.6 ??F (36.4 ??C) 100 16 133/82 97 %   12/18/20 0001 98.8 ??F (37.1 ??C) 95 15 134/81 95 %   12/17/20 2004 98.3 ??F (36.8 ??C) 89 16 125/78 94 %   12/17/20 1715 98.4 ??F (36.9 ??C) 98 16 132/78 94 %   12/17/20 1638 99.3 ??F (37.4 ??C) 97 15 130/81 95 %   12/17/20 1513 98.9 ??F (37.2 ??C) 95 18 123/70 94 %   12/17/20 1400 99 ??F (37.2 ??C) 100 14 138/74 94 %   12/17/20 1345 -- 97 15 132/84 96 %   12/17/20 1330 -- 94 13 129/65 94 %   12/17/20 1326 97.8 ??F (36.6 ??C) 82 15 (!) 145/80 95 %   12/17/20 1315 -- 84 12 (!) 145/80 96 %   12/17/20 1310 -- 87 10 (!) 145/86 97 %   12/17/20 1305 -- 94 14 137/76 96 %   12/17/20 1300 97.8 ??F (36.6 ??C) 97 11 130/85 97 %   12/17/20 1255 97.8 ??F (36.6 ??C) 97 19 130/85 98 %       Date 12/17/20 0700 - 12/18/20 0659 12/18/20 0700 - 12/19/20 0659   Shift 0700-1859 1900-0659 24 Hour Total 0700-1859 2440-1027 24 Hour Total   INTAKE   P.O.  240 240        P.O.  240 240      I.V.(mL/kg/hr) 1500(1.3) 6635(5.6) 8135(3.4)        Volume (lactated Ringers infusion) 1500  1500        Volume (0.9% sodium chloride infusion)  6635 6635      Shift Total(mL/kg) 1500(15.2) 2536(64.4) 8375(84.7)      OUTPUT   Urine(mL/kg/hr) 800(0.7) 900(0.8) 1700(0.7)        Urine Voided  900 900        Urine Output 300  300        Urine Output (mL) ([REMOVED] Urinary Catheter 12/17/20 Foley) 500  500      Blood 50  50        Estimated Blood Loss 50  50      Shift Total(mL/kg) 850(8.6) 900(9.1) 1750(17.7)      NET  650 5975 6625      Weight (kg) 98.9 98.9 98.9 98.9 98.9 98.9       Physical Exam     General:  alert, cooperative, no distress     Cardiac:  Regular rate and rhythm        Lungs:  clear to auscultation bilaterally  Abdomen:  soft, non-tender, without masses or organomegaly       Wound:  clean, dry   Extremity: extremities normal, atraumatic, no cyanosis or edema    Data Review  Lab Results   Component Value Date/Time    WBC 13.3 (H) 12/18/2020 05:11 AM  ABS. NEUTROPHILS 9.0 (H) 12/15/2020 05:42 AM    HGB 7.5 (L) 12/18/2020 05:11 AM    HCT 23.9 (L) 12/18/2020 05:11 AM    MCV 77.6 (L) 12/18/2020 05:11 AM    MCH 24.4 (L) 12/18/2020 05:11 AM    PLATELET 321 12/18/2020 05:11 AM     Lab Results   Component Value Date/Time    Sodium 138 12/18/2020 05:11 AM    Potassium 3.5 12/18/2020 05:11 AM    Chloride 110 (H) 12/18/2020 05:11 AM    CO2 22 12/18/2020 05:11 AM    Glucose 113 (H) 12/18/2020 05:11 AM    BUN 6 12/18/2020 05:11 AM    Creatinine 0.65 12/18/2020 05:11 AM    Calcium 8.2 (L) 12/18/2020 05:11 AM    Albumin 3.0 (L) 12/15/2020 05:42 AM    Bilirubin, total 0.2 12/15/2020 05:42 AM    ALT (SGPT) 14 12/15/2020 05:42 AM    Alk. phosphatase 91 12/15/2020 05:42 AM     IMPRESSION/PLAN:    1 Day Post-Op Procedure(s):  LAPAROSCOPIC RESECTION OF MASS, TOTAL LAPAROSCOPIC HYSTERECTOMY, RIGHT SALPIGOOOPHORECTOMY. LYSIS OF ADHESIONS for Pelvic pain and  right adnexa mass.    Oncologic:  40 yo WF admitted due to a pelvic mass and pain.  She was recommended surgical resection.     Heme/CV:  Hgb 7.5. VSS.  Hemodynamically stable.    Renal:  Creatinine 0.65.  good urine output.  Voiding without a problem.        FEN/GI:  No nausea. Tolerating regular diet.    ID/Wound:  Afebrile.  Incisions and abdomen benign.  Home with oral antibiotics to complete treatment for UTI.    PPX:  SCDs.  Incentive spirometer.  Continue OOB.   Dispostion:  Doing well postoperatively. Continue routine post op care.  Discharge instructions reviewed with  patient. Questions answered.  [place] for discharge.  Follow up with Dr. Mathews Robinsons in 4 weeks.        Oneta Rack, PA-C  12/18/2020  9:26 AM

## 2021-01-07 ENCOUNTER — Ambulatory Visit
Admit: 2021-01-07 | Discharge: 2021-01-07 | Payer: PRIVATE HEALTH INSURANCE | Attending: Family Medicine | Primary: Family Medicine

## 2021-01-07 ENCOUNTER — Ambulatory Visit: Attending: Family Medicine | Primary: Family Medicine

## 2021-01-07 DIAGNOSIS — Z09 Encounter for follow-up examination after completed treatment for conditions other than malignant neoplasm: Secondary | ICD-10-CM

## 2021-01-07 NOTE — Progress Notes (Signed)
Transitional Care Management Progress Note    Patient: Teresa Beasley  DOB: 1980-04-21  PCP: Lawrence Santiago, MD    Date of office visit: 01/07/2021   Date of admission: 12/15/20  Date of discharge: 12/18/20  Hospital:     Call initiated w/i 2 business dates of discharge: *No response documented in the last 14 days   Date of the most recent call to the patient: *No documented post hospital discharge outreach found in the last 14 days      Assessment/Plan:        Subjective:   Teresa Beasley is a 40 y.o. female presenting today for follow-up after hospital discharge.  This encounter and supporting documentation was reviewed if available.  Medication reconciliation  performed today.  The main problem requiring admission was abdominal pain.   Complications during admission:       Interval history/Current status: stable    Admitting symptoms have:       Medications marked "taking" at this time:  Prior to Admission medications    Medication Sig Start Date End Date Taking? Authorizing Provider   ibuprofen (MOTRIN) 800 mg tablet Take 1 Tablet by mouth every six (6) hours as needed for Pain. 12/18/20  Yes Dunn, Maple Mirza, PA-C   conjugated estrogens (PREMARIN) 0.625 mg tablet Take 1 Tablet by mouth daily for 90 days. 12/18/20 03/18/21 Yes Dunn, Maple Mirza, PA-C   busPIRone (BUSPAR) 15 mg tablet TAKE 1 TABLET BY MOUTH EVERY DAY AS NEEDED FOR ANXIETY 11/28/20  Yes Lawrence Santiago, MD   levothyroxine (SYNTHROID) 25 mcg tablet TAKE 1 TABLET BY MOUTH EVERY DAY BEFORE BREAKFAST  Patient taking differently: Take 25 mcg by mouth nightly. 11/03/20  Yes Lawrence Santiago, MD   atorvastatin (LIPITOR) 20 mg tablet TAKE 1 TABLET BY MOUTH EVERY DAY  Patient taking differently: Take 20 mg by mouth every morning. 11/03/20  Yes Lawrence Santiago, MD   lisinopril-hydroCHLOROthiazide (PRINZIDE, ZESTORETIC) 20-12.5 mg per tablet TAKE 1 TABLET BY MOUTH EVERY DAY  Patient taking differently: Take 1 Tablet by mouth every morning. 10/24/20  Yes Lawrence Santiago, MD    docusate sodium (COLACE) 100 mg capsule Take 1 Capsule by mouth daily for 30 days. Stop taking if you develop loose stools  Patient not taking: Reported on 01/07/2021 12/18/20 01/17/21  Kerby Moors A, PA-C   ciprofloxacin HCl (CIPRO) 250 mg tablet Take 1 Tablet by mouth two (2) times a day.  Patient not taking: Reported on 01/07/2021 12/18/20   Kerby Moors A, PA-C   polyethylene glycol (MIRALAX) 17 gram packet Take 1 Packet by mouth daily. May take 1-2 packets as needed daily for constipation.  Patient not taking: No sig reported 11/27/20   Darliss Cheney, MD   omeprazole (PRILOSEC) 40 mg capsule Take 40 mg by mouth every morning.  Patient not taking: No sig reported    Provider, Historical        ROS:                Objective:   Visit Vitals  BP 138/81 (BP 1 Location: Right upper arm, BP Patient Position: Sitting, BP Cuff Size: Adult)   Pulse 99   Temp 98.7 ??F (37.1 ??C) (Oral)   Resp 17   Ht 5\' 7"  (1.702 m)   Wt 209 lb 6.4 oz (95 kg)   LMP  (LMP Unknown)   SpO2 98%   BMI 32.80 kg/m??               We  discussed the expected course, resolution and complications of the diagnosis(es) in detail.  Medication risks, benefits, costs, interactions, and alternatives were discussed as indicated.  I advised her to contact the office if her condition worsens, changes or fails to improve as anticipated. She expressed understanding with the diagnosis(es) and plan.     Lawrence Santiago, MD

## 2021-01-07 NOTE — Progress Notes (Signed)
 1. Have you been to the ER, urgent care clinic since your last visit?  Hospitalized since your last visit? Yes When: 12/14/20 Where: [facility] Reason for visit: pain in lower right abdomen    2. Have you seen or consulted any other health care providers outside of the [facility] since your last visit? Yes When: 12/14/20 Where: [facility] Reason for visit: pain in lower right abdomen      3. For patients aged 27-75: Has the patient had a colonoscopy / FIT/ Cologuard? NA - based on age      If the patient is female:    4. For patients aged 5-74: Has the patient had a mammogram within the past 2 years? Yes - no Care Gap present      5. For patients aged 21-65: Has the patient had a pap smear? Yes - no Care Gap present     Chief Complaint   Patient presents with    Hospital Follow Up     Pt had a complete hysterectomy and a mass removal       Visit Vitals  BP 138/81 (BP 1 Location: Right upper arm, BP Patient Position: Sitting, BP Cuff Size: Adult)   Pulse 99   Temp 98.7 F (37.1 C) (Oral)   Resp 17   Ht 5' 7 (1.702 m)   Wt 209 lb 6.4 oz (95 kg)   LMP  (LMP Unknown)   SpO2 98%   BMI 32.80 kg/m     Pt is here for a hospital follow up.

## 2021-01-07 NOTE — Addendum Note (Signed)
Addendum Note by Lawrence Santiago, MD at 01/07/21 1300                Author: Lawrence Santiago, MD  Service: --  Author Type: Physician       Filed: 01/07/21 1406  Encounter Date: 01/07/2021  Status: Signed          Editor: Lawrence Santiago, MD (Physician)          Addended by: Lawrence Santiago on: 01/07/2021 02:06 PM    Modules accepted: Level of Service

## 2021-01-07 NOTE — Progress Notes (Signed)
Please call the patient regarding her abnormal result.Pt to start ferrous sulphate daily-sent to pharm

## 2021-01-08 ENCOUNTER — Encounter

## 2021-01-08 LAB — CBC WITH AUTO DIFFERENTIAL
Basophils %: 1 %
Basophils Absolute: 0.1 10*3/uL (ref 0.0–0.2)
Eosinophils %: 6 %
Eosinophils Absolute: 0.5 10*3/uL — ABNORMAL HIGH (ref 0.0–0.4)
Granulocyte Absolute Count: 0 10*3/uL (ref 0.0–0.1)
Hematocrit: 29.9 % — ABNORMAL LOW (ref 34.0–46.6)
Hemoglobin: 8.9 g/dL — ABNORMAL LOW (ref 11.1–15.9)
Immature Granulocytes: 0 %
Lymphocytes %: 23 %
Lymphocytes Absolute: 2 10*3/uL (ref 0.7–3.1)
MCH: 22.1 pg — ABNORMAL LOW (ref 26.6–33.0)
MCHC: 29.8 g/dL — ABNORMAL LOW (ref 31.5–35.7)
MCV: 74 fL — ABNORMAL LOW (ref 79–97)
Monocytes %: 6 %
Monocytes Absolute: 0.5 10*3/uL (ref 0.1–0.9)
Neutrophils %: 64 %
Neutrophils Absolute: 5.6 10*3/uL (ref 1.4–7.0)
Platelets: 346 10*3/uL (ref 150–450)
RBC: 4.03 x10E6/uL (ref 3.77–5.28)
RDW: 14.5 % (ref 11.7–15.4)
WBC: 8.8 10*3/uL (ref 3.4–10.8)

## 2021-01-08 LAB — CBC WITH AUTOMATED DIFF
ABS. BASOPHILS: 0.1 10*3/uL (ref 0.0–0.2)
ABS. EOSINOPHILS: 0.5 10*3/uL — ABNORMAL HIGH (ref 0.0–0.4)
ABS. IMM. GRANS.: 0 10*3/uL (ref 0.0–0.1)
ABS. MONOCYTES: 0.5 10*3/uL (ref 0.1–0.9)
ABS. NEUTROPHILS: 5.6 10*3/uL (ref 1.4–7.0)
Abs Lymphocytes: 2 10*3/uL (ref 0.7–3.1)
BASOPHILS: 1 %
EOSINOPHILS: 6 %
HCT: 29.9 % — ABNORMAL LOW (ref 34.0–46.6)
HGB: 8.9 g/dL — ABNORMAL LOW (ref 11.1–15.9)
IMMATURE GRANULOCYTES: 0 %
Lymphocytes: 23 %
MCH: 22.1 pg — ABNORMAL LOW (ref 26.6–33.0)
MCHC: 29.8 g/dL — ABNORMAL LOW (ref 31.5–35.7)
MCV: 74 fL — ABNORMAL LOW (ref 79–97)
MONOCYTES: 6 %
NEUTROPHILS: 64 %
PLATELET: 346 10*3/uL (ref 150–450)
RBC: 4.03 x10E6/uL (ref 3.77–5.28)
RDW: 14.5 % (ref 11.7–15.4)
WBC: 8.8 10*3/uL (ref 3.4–10.8)

## 2021-01-08 MED ORDER — FERROUS SULFATE 325 MG (65 MG ELEMENTAL IRON) TAB
325 mg (65 mg iron) | ORAL_TABLET | Freq: Every day | ORAL | 0 refills | Status: DC
Start: 2021-01-08 — End: 2021-03-02

## 2021-01-08 NOTE — Telephone Encounter (Signed)
Called pt regarding her abnormal lab results and to pick up medication dr Dorthea Cove prescribed.

## 2021-01-08 NOTE — Telephone Encounter (Signed)
Pt received the script you sent for her iron deficiency She received her lab results and wants to know if she should also be worried about her elevated Eosinophils?

## 2021-01-09 ENCOUNTER — Encounter: Attending: Specialist | Primary: Family Medicine

## 2021-01-15 MED ORDER — LISINOPRIL-HYDROCHLOROTHIAZIDE 20 MG-12.5 MG TAB
20-12.5 mg | ORAL_TABLET | ORAL | 0 refills | Status: DC
Start: 2021-01-15 — End: 2021-04-12

## 2021-01-22 ENCOUNTER — Encounter: Attending: Specialist | Primary: Family Medicine

## 2021-01-22 NOTE — Progress Notes (Signed)
[facility], [place] [address]  P [phone]   F [phone]    Postoperative Office Note  Patient ID:  Name: Teresa Beasley  MRM: [id number]  DOB: Sep 16, 1980/40 y.o.  Date: 01/22/2021    Diagnosis:     ICD-10-CM ICD-9-CM    1. Pelvic mass  R19.00 789.30       2. 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 female who presents for followup postoperative care following laparoscopic resection of mass, total laparoscopic hysterectomy, right salpingooophorectomy, lysis of adhesions cystoscopy.     Operative findings:   Extensive adhesions of omentum to anterior abdominal wall.  Large right adnexal mass, consistent with a hemorrhagic cyst.  Normal uterus.  Absent left tube and ovary.  Rectum adherent to posterior aspect of uterus and cervix.  Right adnexa densely adherent to right posterior aspect of uterus.  Dilated right ureter.  Frozen section pathology demonstrated a benign hemorrhagic cyst.  Cystoscopy demonstrated flow of urine from the both ureters.       The patient's pathology revealed:    FINAL PATHOLOGIC DIAGNOSIS   Uterus, cervix, right fallopian tube and right ovary; total hysterectomy and right salpingo-oophorectomy:    Uterus and cervix:   Benign inactive endometrium.   Benign myometrium with small intramural leiomyoma (0.5 cm).   Ecto-and endocervical mucosa with immature squamous metaplasia and microglandular hyperplasia; (see comment).   Right fallopian tube and ovary:   Benign fallopian with acute and chronic inflammation, mucosal endometrialization, reactive/reparative epithelial changes and serosal adhesions with involvement of endometriosis.   Benign ovary with cystic endometriosis and serosal surface adhesions; (see comment).    Comment   Cervix:   Immunohistochemical staining with P16 and Ki-67 performed on tissue block 1B stains patchy positive predominantly within the basal half of the immature metaplastic squamous epithelium.   Fallopian tube and ovary:   Immunohistochemical staining with CD10 performed on representative sections of fallopian tube and ovary (1G, 1I and 1K) highlights stroma of endometriotic involvement. P53 and Ki-67 staining on block 1G (fallopian tube) stains patchy positive within the fallopian tube epithelium. ER stain highlights areas of fallopian tube endometrialization.       Currently she has no problems with eating, bowel movements, voiding, or their wound.  Appetite is good. Eating a regular diet without difficulty.   Bowel movements are regular.  The patient is not having any pain.    Medications:     Current Outpatient Medications on File Prior to Visit   Medication Sig Dispense Refill  ALBUTEROL IN Take  by inhalation.      amoxicillin-clavulanate (Augmentin) 875-125 mg per tablet Take  by mouth every twelve (12) hours.      lisinopril-hydroCHLOROthiazide (PRINZIDE, ZESTORETIC) 20-12.5 mg per tablet TAKE 1 TABLET BY MOUTH EVERY DAY 90 Tablet 0    ferrous sulfate 325 mg (65 mg iron) tablet Take 1 Tablet by mouth Daily (before breakfast) for 90 days. 90 Tablet 0    conjugated estrogens (PREMARIN) 0.625 mg tablet Take 1 Tablet by mouth daily for 90 days. 30 Tablet 2    busPIRone (BUSPAR)  15 mg tablet TAKE 1 TABLET BY MOUTH EVERY DAY AS NEEDED FOR ANXIETY 30 Tablet 2    levothyroxine (SYNTHROID) 25 mcg tablet TAKE 1 TABLET BY MOUTH EVERY DAY BEFORE BREAKFAST (Patient taking differently: Take 25 mcg by mouth nightly.) 90 Tablet 0    atorvastatin (LIPITOR) 20 mg tablet TAKE 1 TABLET BY MOUTH EVERY DAY (Patient taking differently: Take 20 mg by mouth every morning.) 90 Tablet 0    ibuprofen (MOTRIN) 800 mg tablet Take 1 Tablet by mouth every six (6) hours as needed for Pain. 40 Tablet 0    ciprofloxacin HCl (CIPRO) 250 mg tablet Take 1 Tablet by mouth two (2) times a day. (Patient not taking: No sig reported) 3 Tablet 0    polyethylene glycol (MIRALAX) 17 gram packet Take 1 Packet by mouth daily. May take 1-2 packets as needed daily for constipation. (Patient not taking: No sig reported) 14 Packet 1    omeprazole (PRILOSEC) 40 mg capsule Take 40 mg by mouth every morning. (Patient not taking: No sig reported)       No current facility-administered medications on file prior to visit.     Allergies:   No Known Allergies  Past Medical History:   Diagnosis Date    Acquired hypothyroidism 11/02/2018    Anxiety     Bilateral ovarian cysts     Removal 03/24/2010    Ear problems     Essential hypertension 11/02/2018    Gastro-esophageal reflux disease with esophagitis 11/02/2018    Generalized anxiety disorder 11/02/2018    GERD (gastroesophageal reflux disease)     High cholesterol     Hypercholesterolemia     Hypernatremia 11/02/2018    Obesity 11/02/2018    Obesity (BMI 30-39.9) 11/02/2018    Otalgia of right ear 11/02/2018    URI (upper respiratory infection) 11/02/2018     Past Surgical History:   Procedure Laterality Date    HX CHOLECYSTECTOMY  11/27/2020    HX ENDOSCOPY      HX GYN      HX OVARIAN CYST REMOVAL  2007    OVARIAN CYST & FALLOPIAN TUBE REMOVED BY Dr. Kalman Drape WISDOM TEETH EXTRACTION      IR CHOLECYSTOSTOMY PERCUTANEOUS       Social History     Socioeconomic History    Marital status:  MARRIED     Spouse name: Not on file    Number of children: Not on file    Years of education: Not on file    Highest education level: Not on file   Occupational History    Not on file   Tobacco Use    Smoking status: Never    Smokeless tobacco: Never   Vaping Use    Vaping Use: Never used   Substance and Sexual Activity    Alcohol use: Not Currently    Drug use: Never  Sexual activity: Yes     Partners: Male     Birth control/protection: Implant     Comment: nuvaring   Other Topics Concern    Not on file   Social History Narrative    Not on file     Social Determinants of Health     Financial Resource Strain: Low Risk     Difficulty of Paying Living Expenses: Not hard at all   Food Insecurity: No Food Insecurity    Worried About Charity fundraiser in the Last Year: Never true    Ran Out of Food in the Last Year: Never true   Transportation Needs: Not on file   Physical Activity: Not on file   Stress: Not on file   Social Connections: Not on file   Intimate Partner Violence: Not on file   Housing Stability: Not on file       OBJECTIVE:    Vitals:   Vitals:    01/22/21 0937   BP: 127/84   Pulse: 83   Weight: 208 lb (94.3 kg)   Height: 5' 7.01" (1.702 m)     Physical Examination:     General:  alert, cooperative, no distress   Lungs: lungs clear to auscultation   Cardiac: Regular rate and rhythm   Abdomen: soft, bowel sounds active, non-tender  No CVA tenderness   Incision: healing well   Pelvic: External genitalia: normal general appearance  Urinary system: urethral meatus normal  Vaginal: normal mucosa without prolapse or lesions  Cervix: removed surgically  Adnexa: removed surgically  Uterus: removed surgically   Rectal: not done   Extremity:   extremities normal, atraumatic, no cyanosis or edema     IMPRESSION:    Teresa Beasley is doing well postoperatively.  She has a diagnosis of pelvic endometriosis and no apparent complications from surgery.    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  Implications of diagnosis discussed at length.  All questions answered.   The patient may return to all of her normal activities without restrictions at this time     I will see the patient back prn. The patient is advised to call our office with any problems or concerns.      An electronic signature was used to sign this note.    Albertha Ghee, MD  01/22/2021

## 2021-01-22 NOTE — Progress Notes (Signed)
Post operative follow up.

## 2021-01-30 MED ORDER — BUSPIRONE 15 MG TAB
15 mg | ORAL_TABLET | ORAL | 2 refills | Status: DC
Start: 2021-01-30 — End: 2021-05-05

## 2021-01-31 MED ORDER — ATORVASTATIN 20 MG TAB
20 mg | ORAL_TABLET | ORAL | 0 refills | Status: DC
Start: 2021-01-31 — End: 2021-05-03

## 2021-02-05 MED ORDER — LEVOTHYROXINE 25 MCG TAB
25 mcg | ORAL_TABLET | ORAL | 0 refills | Status: AC
Start: 2021-02-05 — End: 2021-05-03

## 2021-03-02 MED ORDER — FERROUS SULFATE 325 MG (65 MG ELEMENTAL IRON) TAB
325 mg (65 mg iron) | ORAL_TABLET | ORAL | 2 refills | Status: DC
Start: 2021-03-02 — End: 2021-05-03

## 2021-03-08 ENCOUNTER — Encounter: Attending: Family Medicine | Primary: Family Medicine

## 2021-03-11 MED ORDER — PREMARIN 0.625 MG TABLET
0.625 mg | ORAL_TABLET | ORAL | 2 refills | Status: DC
Start: 2021-03-11 — End: 2021-06-03

## 2021-04-12 MED ORDER — LISINOPRIL-HYDROCHLOROTHIAZIDE 20 MG-12.5 MG TAB
ORAL_TABLET | ORAL | 0 refills | Status: AC
Start: 2021-04-12 — End: ?

## 2021-05-03 ENCOUNTER — Ambulatory Visit: Admit: 2021-05-03 | Payer: PRIVATE HEALTH INSURANCE | Attending: Family Medicine | Primary: Family Medicine

## 2021-05-03 ENCOUNTER — Ambulatory Visit: Attending: Family Medicine | Primary: Family Medicine

## 2021-05-03 DIAGNOSIS — I119 Hypertensive heart disease without heart failure: Secondary | ICD-10-CM

## 2021-05-03 MED ORDER — FERROUS SULFATE 325 MG (65 MG ELEMENTAL IRON) TAB
325 mg (65 mg iron) | ORAL_TABLET | ORAL | 2 refills | Status: AC
Start: 2021-05-03 — End: ?

## 2021-05-03 MED ORDER — LEVOTHYROXINE 25 MCG TAB
25 mcg | ORAL_TABLET | Freq: Every day | ORAL | 0 refills | Status: AC
Start: 2021-05-03 — End: ?

## 2021-05-03 MED ORDER — ATORVASTATIN 20 MG TAB
20 mg | ORAL_TABLET | Freq: Every day | ORAL | 0 refills | Status: AC
Start: 2021-05-03 — End: ?

## 2021-05-03 NOTE — Progress Notes (Signed)
Progress Notes by Lawrence Santiago, MD at 05/03/21 1615                Author: Lawrence Santiago, MD  Service: --  Author Type: Physician       Filed: 05/03/21 1622  Encounter Date: 05/03/2021  Status: Signed          Editor: Lawrence Santiago, MD (Physician)               HPI      Teresa Beasley is a 41 y.o. female and presents today for Follow Up Chronic Condition   .   HPI    Allergies      No Known Allergies       Medications        Current Outpatient Medications         Medication  Sig  Dispense          ?  ferrous sulfate 325 mg (65 mg iron) tablet  TAKE 1 TABLET BY MOUTH DAILY BEFORE BREAKFAST  30 Tablet     ?  lisinopril-hydroCHLOROthiazide (PRINZIDE, ZESTORETIC) 20-12.5 mg per tablet  TAKE 1 TABLET BY MOUTH EVERY DAY  90 Tablet     ?  Premarin 0.625 mg tablet  TAKE 1 TABLET BY MOUTH EVERY DAY  30 Tablet     ?  levothyroxine (SYNTHROID) 25 mcg tablet  TAKE 1 TABLET BY MOUTH EVERY DAY BEFORE BREAKFAST  90 Tablet     ?  atorvastatin (LIPITOR) 20 mg tablet  TAKE 1 TABLET BY MOUTH EVERY DAY  90 Tablet     ?  busPIRone (BUSPAR) 15 mg tablet  TAKE 1 TABLET BY MOUTH EVERY DAY AS NEEDED FOR ANXIETY  30 Tablet     ?  ALBUTEROL IN  Take  by inhalation.       ?  amoxicillin-clavulanate (AUGMENTIN) 875-125 mg per tablet  Take  by mouth every twelve (12) hours.       ?  ibuprofen (MOTRIN) 800 mg tablet  Take 1 Tablet by mouth every six (6) hours as needed for Pain.  40 Tablet     ?  ciprofloxacin HCl (CIPRO) 250 mg tablet  Take 1 Tablet by mouth two (2) times a day. (Patient not taking: No sig reported)  3 Tablet     ?  polyethylene glycol (MIRALAX) 17 gram packet  Take 1 Packet by mouth daily. May take 1-2 packets as needed daily for constipation. (Patient not taking: No sig reported)  14 Packet          ?  omeprazole (PRILOSEC) 40 mg capsule  Take 40 mg by mouth every morning. (Patient not taking: No sig reported)            No current facility-administered medications for this visit.            Health  Maintenance        Health Maintenance Due        Topic  Date Due         ?  Hepatitis C Screening   Never done     ?  DTaP/Tdap/Td series (1 - Tdap)  Never done     ?  COVID-19 Vaccine (4 - Booster for Moderna series)  04/14/2020     ?  Flu Vaccine (1)  10/22/2020         ?  Lipid Screen   12/09/2020            Problem List  Patient Active Problem List           Diagnosis  Date Noted         ?  Hospital discharge follow-up  01/07/2021     ?  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  Pelvic mass  12/15/2020     ?  S/P cholecystectomy  12/07/2020     ?  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  Mixed hyperlipidemia  02/23/2019     ?  Anxiety  11/12/2018     ?  Essential (primary) hypertension  11/12/2018     ?  Obesity (BMI 30-39.9)  11/02/2018     ?  Gastro-esophageal reflux disease with esophagitis  11/02/2018     ?  Generalized anxiety disorder  11/02/2018     ?  Acquired hypothyroidism  11/02/2018     ?  Otalgia of right ear  11/02/2018     ?  URI (upper respiratory infection)  11/02/2018     ?  Hypernatremia  11/02/2018     ?  Ear problems           ?  GERD (gastroesophageal reflux disease)              Family Hx        Family History         Problem  Relation  Age of Onset          ?  Hypertension  Mother       ?  Mult Sclerosis  Father       ?  COPD  Father       ?  Emphysema  Father       ?  Thyroid Disease  Sister            ?  Anesth Problems  Neg Hx              Social Hx        Social History          Socioeconomic History         ?  Marital status:  MARRIED       Tobacco Use         ?  Smoking status:  Never     ?  Smokeless tobacco:  Never       Vaping Use         ?  Vaping Use:  Never used       Substance and Sexual Activity         ?  Alcohol use:  Not Currently     ?  Drug use:  Never     ?  Sexual activity:  Yes              Partners:  Male         Birth control/protection:  Implant             Comment: nuvaring           Social Determinants of Health          Financial Resource Strain: Low Risk         ?  Difficulty of Paying Living Expenses: Not hard at all       Food Insecurity: No Food Insecurity        ?  Worried About Programme researcher, broadcasting/film/video in the  Last Year: Never true        ?  Ran Out of Food in the Last Year: Never true            Surgical Hx        Past Surgical History:         Procedure  Laterality  Date          ?  HX CHOLECYSTECTOMY    11/27/2020     ?  HX ENDOSCOPY         ?  HX GYN         ?  HX OVARIAN CYST REMOVAL    2007          OVARIAN CYST & FALLOPIAN TUBE REMOVED BY Dr. Daphine DeutscherMartin          ?  HX WISDOM TEETH EXTRACTION              ?  IR CHOLECYSTOSTOMY PERCUTANEOUS                   Vitals      Visit Vitals      BP  132/80 (BP 1 Location: Right upper arm, BP Patient Position: Sitting, BP Cuff Size: Adult)     Pulse  90     Temp  98.6 ??F (37 ??C) (Oral)     Resp  16     Ht  5\' 7"  (1.702 m)     Wt  215 lb (97.5 kg)     LMP   (LMP Unknown)     SpO2  96%        BMI  33.67 kg/m??            ROS      Review of Systems    Constitutional: Negative.  Negative for chills and fever.    HENT: Negative.  Negative for congestion, ear discharge, hearing loss, nosebleeds and tinnitus.     Eyes: Negative.  Negative for blurred vision, double vision, photophobia and pain.    Respiratory: Negative.  Negative for cough, hemoptysis and sputum production.     Cardiovascular: Negative.  Negative for chest pain and palpitations.    Gastrointestinal: Negative.  Negative for heartburn, nausea and vomiting.    Genitourinary: Negative.  Negative for dysuria, frequency and urgency.    Musculoskeletal: Negative.  Negative for back pain and myalgias.    Skin: Negative.     Neurological: Negative.  Negative for dizziness, tingling, weakness and headaches.    Endo/Heme/Allergies: Negative.     Psychiatric/Behavioral: Negative.  Negative for depression and suicidal ideas. The patient  does not have insomnia.     All other systems reviewed and are  negative.        Physical Exam         Physical Exam   Vitals and nursing note reviewed.    Constitutional:        Appearance: Normal appearance. She is obese.    HENT:       Head: Normocephalic and atraumatic.       Right Ear: Tympanic membrane, ear canal and external ear normal.       Left Ear: Tympanic membrane, ear canal and external ear normal.       Nose: Nose normal.       Mouth/Throat:       Mouth: Mucous membranes are moist.       Pharynx: Oropharynx is clear. No oropharyngeal exudate or posterior oropharyngeal erythema.  Eyes:       General: No scleral icterus.         Right eye: No discharge.          Left eye: No discharge.       Extraocular Movements: Extraocular movements intact.       Conjunctiva/sclera: Conjunctivae normal.       Pupils: Pupils are equal, round, and reactive to light.    Neck:       Vascular: No carotid bruit.    Cardiovascular:       Rate and Rhythm: Normal rate.       Pulses: Normal pulses.       Heart sounds: Normal heart sounds. No murmur heard.     No gallop.    Pulmonary:       Effort: Pulmonary effort is normal. No respiratory distress.       Breath sounds: Normal breath sounds. No stridor. No wheezing, rhonchi or rales.    Chest:       Chest wall: No tenderness.    Abdominal:       General: Bowel sounds are normal. There is no distension.       Palpations: Abdomen is soft. There is no mass.       Tenderness: There is no abdominal tenderness. There is no right CVA tenderness, left CVA tenderness or rebound.       Hernia: No hernia is present.     Musculoskeletal:          General: No swelling, tenderness, deformity or signs of injury. Normal range of motion.       Cervical back: Normal range of motion and neck supple. No rigidity. No muscular tenderness.       Right lower leg: No edema.       Left lower leg: No edema.     Lymphadenopathy:       Cervical: No cervical adenopathy.    Skin:      General: Skin is warm.       Capillary Refill: Capillary refill takes 2 to 3  seconds.       Coloration: Skin is not jaundiced or pale.       Findings: No bruising, erythema, lesion or rash.    Neurological:       General: No focal deficit present.       Mental Status: She is alert and oriented to person, place, and time.       Cranial Nerves: No cranial nerve deficit.       Sensory: No sensory deficit.       Motor: No weakness.       Coordination: Coordination normal.       Gait: Gait normal.       Deep Tendon Reflexes: Reflexes normal.    Psychiatric:          Mood and Affect: Mood normal.          Behavior: Behavior normal.          Thought Content: Thought content normal.          Judgment: Judgment normal.           Assessment/Plan      Diagnoses and all orders for this visit:      1. 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 Gastroesophageal reflux disease with esophagitis without hemorrhage      3. Acquired hypothyroidism   -     T4, FREE   -  TSH 3RD GENERATION      4. Mixed hyperlipidemia      5. S/P cholecystectomy      6. Iron deficiency anemia secondary to inadequate dietary iron intake   -     CBC W/O DIFF      Other orders   -     atorvastatin (LIPITOR) 20 mg tablet; Take 1 Tablet by mouth daily.   -     levothyroxine (SYNTHROID) 25 mcg tablet; Take 1 Tablet by mouth Daily (before breakfast). Indications: a condition with low thyroid hormone levels          Health Maintenance Items reviewed with patient as noted.

## 2021-05-03 NOTE — Progress Notes (Signed)
 Progress Notes  by Harris, Zhane at 05/03/21 1615                Author: Harris, Zhane  Service: --  Author Type: Medical Assistant       Filed: 05/03/21 1622  Encounter Date: 05/03/2021  Status: Signed          Editor: Harris, Zhane (Medical Assistant)               1. Have you been to the ER, urgent care clinic since your last visit?  Hospitalized since your last visit? No      2. Have you seen or consulted any other health care providers outside of the [facility] since your last visit? No       3. For patients aged 59-75: Has the patient had a colonoscopy / FIT/ Cologuard? NA - based on age         If the patient is female:      4. For patients aged 6-74: Has the patient had a mammogram within the past 2 years? Yes - no Care Gap present         5. For patients aged 21-65: Has the patient had a pap smear? Yes - no Care Gap present         Chief Complaint       Patient presents with        ?  Follow Up Chronic Condition        Visit Vitals      BP  132/80 (BP 1 Location: Right upper arm, BP Patient Position: Sitting, BP Cuff Size: Adult)     Pulse  90     Temp  98.6 F (37 C) (Oral)     Resp  16     Ht  5' 7 (1.702 m)     Wt  215 lb (97.5 kg)     LMP   (LMP Unknown)     SpO2  96%        BMI  33.67 kg/m        Pt is here for a follow up.

## 2021-05-05 MED ORDER — BUSPIRONE 15 MG TAB
15 mg | ORAL_TABLET | ORAL | 2 refills | Status: AC
Start: 2021-05-05 — End: ?

## 2021-05-17 ENCOUNTER — Ambulatory Visit
Admit: 2021-05-17 | Discharge: 2021-05-17 | Payer: PRIVATE HEALTH INSURANCE | Attending: Family Medicine | Primary: Family Medicine

## 2021-05-17 ENCOUNTER — Emergency Department: Admit: 2021-05-18 | Payer: PRIVATE HEALTH INSURANCE | Primary: Family Medicine

## 2021-05-17 ENCOUNTER — Ambulatory Visit: Attending: Family Medicine | Primary: Family Medicine

## 2021-05-17 DIAGNOSIS — I119 Hypertensive heart disease without heart failure: Secondary | ICD-10-CM

## 2021-05-17 DIAGNOSIS — R079 Chest pain, unspecified: Secondary | ICD-10-CM

## 2021-05-17 DIAGNOSIS — R0789 Other chest pain: Secondary | ICD-10-CM

## 2021-05-17 NOTE — Progress Notes (Signed)
 1. Have you been to the ER, urgent care clinic since your last visit?  Hospitalized since your last visit? No    2. Have you seen or consulted any other health care providers outside of the [facility] since your last visit? No     3. For patients aged 41-75: Has the patient had a colonoscopy / FIT/ Cologuard? NA - based on age      If the patient is female:    4. For patients aged 26-74: Has the patient had a mammogram within the past 2 years? No      5. For patients aged 21-65: Has the patient had a pap smear? No     Chief Complaint   Patient presents with    Other     Pt would like to discuss a few things regarding her hysterectomy      Visit Vitals  BP 138/74 (BP 1 Location: Right upper arm, BP Patient Position: Sitting, BP Cuff Size: Adult)   Pulse 91   Temp 97.9 F (36.6 C) (Oral)   Resp 16   Ht 5' 7 (1.702 m)   Wt 213 lb (96.6 kg)   LMP  (LMP Unknown)   SpO2 97%   BMI 33.36 kg/m     Pt is here because she would like to discuss a few things.

## 2021-05-17 NOTE — ED Notes (Signed)
 PT reports to ED with complaints of chest tightness and numbness/tingling in her neck/face and down her arms. She states that she has had this on and off since her hysterectomy this past fall and notices that she experiences it when she is anxious.     PT states that she had her primary care physician complete an EKG today at 1630.

## 2021-05-17 NOTE — ED Provider Notes (Signed)
13F w/ hx HTN, HLD, obesity, thyroid disease p/w 1d chest pain and palpitations. Pt states that has had recurrent chest tightness and palpitations. Unrelated to activity or exertion. She notes has been feeling emotional stress and anxiety recently which making her symptoms worse. Saw her PCP today who referred her to cardiology. She reports nonradiating mid chest tightness. No dizziness or syncope. No F/C, cough, N/V/D or abd pain. No drugs/etoh/smoking or family cardiac hx. She also denies any prior cardiac hx. No recent surgeries, hospitalizations, travel, hx of malignancy, exogenous estrogen use, hemoptysis, LE pain/swelling, or hx of PE/DVT.         Past Medical History:   Diagnosis Date    Acquired hypothyroidism 11/02/2018    Anxiety     Bilateral ovarian cysts     Removal 03/24/2010    Ear problems     Essential hypertension 11/02/2018    Gastro-esophageal reflux disease with esophagitis 11/02/2018    Generalized anxiety disorder 11/02/2018    GERD (gastroesophageal reflux disease)     High cholesterol     Hypercholesterolemia     Hypernatremia 11/02/2018    Obesity 11/02/2018    Obesity (BMI 30-39.9) 11/02/2018    Otalgia of right ear 11/02/2018    URI (upper respiratory infection) 11/02/2018       Past Surgical History:   Procedure Laterality Date    HX CHOLECYSTECTOMY  11/27/2020    HX ENDOSCOPY      HX GYN      HX HYSTERECTOMY      HX OVARIAN CYST REMOVAL  2007    OVARIAN CYST & FALLOPIAN TUBE REMOVED BY Dr. Betsi Crespi Drape WISDOM TEETH EXTRACTION      IR CHOLECYSTOSTOMY PERCUTANEOUS           Family History:   Problem Relation Age of Onset    Hypertension Mother     Mult Sclerosis Father     COPD Father     Emphysema Father     Thyroid Disease Sister     Anesth Problems Neg Hx        Social History     Socioeconomic History    Marital status: MARRIED     Spouse name: Not on file    Number of children: Not on file    Years of education: Not on file    Highest education level: Not on file   Occupational  History    Not on file   Tobacco Use    Smoking status: Never    Smokeless tobacco: Never   Vaping Use    Vaping Use: Never used   Substance and Sexual Activity    Alcohol use: Not Currently    Drug use: Never    Sexual activity: Yes     Partners: Male     Birth control/protection: Implant     Comment: nuvaring   Other Topics Concern    Not on file   Social History Narrative    Not on file     Social Determinants of Health     Financial Resource Strain: Low Risk     Difficulty of Paying Living Expenses: Not hard at all   Food Insecurity: No Food Insecurity    Worried About Charity fundraiser in the Last Year: Never true    Arboriculturist in the Last Year: Never true   Transportation Needs: Not on file   Physical Activity: Not on file   Stress: Not on  file   Social Connections: Not on file   Intimate Partner Violence: Not on file   Housing Stability: Not on file         ALLERGIES: Patient has no known allergies.    Review of Systems   Constitutional:  Negative for chills, diaphoresis and fever.   HENT:  Negative for facial swelling, mouth sores, nosebleeds, trouble swallowing and voice change.    Eyes:  Negative for pain and visual disturbance.   Respiratory:  Positive for chest tightness. Negative for apnea, cough, choking, shortness of breath, wheezing and stridor.    Cardiovascular:  Positive for chest pain and palpitations. Negative for leg swelling.   Gastrointestinal:  Negative for abdominal distention, abdominal pain, blood in stool, diarrhea, nausea and vomiting.   Genitourinary:  Negative for difficulty urinating, dysuria, flank pain, hematuria and pelvic pain.   Musculoskeletal:  Negative for joint swelling.   Skin:  Negative for color change and rash.   Allergic/Immunologic: Negative for immunocompromised state.   Neurological:  Negative for dizziness, seizures, syncope, speech difficulty and light-headedness.   Hematological:  Does not bruise/bleed easily.   Psychiatric/Behavioral:  Negative for  agitation and behavioral problems.      Vitals:    05/17/21 2338 05/17/21 2353 05/18/21 0008 05/18/21 0033   BP: 126/81 (!) 149/115 (!) 137/91 117/87   Pulse: 77 82 87 82   Resp: '12 12 15 16   ' Temp:       SpO2: 99% 97% 98% 98%   Weight:       Height:                Physical Exam  Vitals and nursing note reviewed.   Constitutional:       General: She is not in acute distress.     Appearance: Normal appearance. She is not ill-appearing or toxic-appearing.   HENT:      Head: Normocephalic and atraumatic.      Right Ear: External ear normal.      Left Ear: External ear normal.      Nose: Nose normal.      Mouth/Throat:      Mouth: Mucous membranes are moist.      Pharynx: Oropharynx is clear. No oropharyngeal exudate or posterior oropharyngeal erythema.   Eyes:      General: No scleral icterus.     Extraocular Movements: Extraocular movements intact.      Conjunctiva/sclera: Conjunctivae normal.      Pupils: Pupils are equal, round, and reactive to light.   Cardiovascular:      Rate and Rhythm: Regular rhythm. Tachycardia present.      Pulses: Normal pulses.      Heart sounds: Normal heart sounds. No murmur heard.    No friction rub. No gallop.   Pulmonary:      Effort: Pulmonary effort is normal. No respiratory distress.      Breath sounds: Normal breath sounds. No stridor. No wheezing, rhonchi or rales.   Abdominal:      General: There is no distension.      Palpations: Abdomen is soft.      Tenderness: There is no abdominal tenderness. There is no guarding or rebound.   Musculoskeletal:         General: No tenderness or deformity. Normal range of motion.      Cervical back: Normal range of motion and neck supple. No rigidity.      Right lower leg: No edema.  Left lower leg: No edema.   Skin:     General: Skin is warm.      Capillary Refill: Capillary refill takes less than 2 seconds.      Coloration: Skin is not jaundiced.   Neurological:      General: No focal deficit present.      Mental Status: She is alert.       Cranial Nerves: No cranial nerve deficit.      Sensory: No sensory deficit.      Motor: No weakness.      Coordination: Coordination normal.   Psychiatric:         Mood and Affect: Mood normal.         Behavior: Behavior normal.         Thought Content: Thought content normal.         Judgment: Judgment normal.        I personally reviewed and independently interpreted EKG, labs and imaging results.    EKG Interpretation   SR, narrow QRS, nl intervals, no STE/STD/TWI    LABORATORY TESTS:  Admission on 05/17/2021, Discharged on 05/18/2021   Component Date Value Ref Range Status    WBC 05/17/2021 9.8  3.6 - 11.0 K/uL Final    RBC 05/17/2021 4.34  3.80 - 5.20 M/uL Final    HGB 05/17/2021 12.7  11.5 - 16.0 g/dL Final    HCT 05/17/2021 37.7  35.0 - 47.0 % Final    MCV 05/17/2021 86.9  80.0 - 99.0 FL Final    MCH 05/17/2021 29.3  26.0 - 34.0 PG Final    MCHC 05/17/2021 33.7  30.0 - 36.5 g/dL Final    RDW 05/17/2021 14.6 (A)  11.5 - 14.5 % Final    PLATELET 05/17/2021 277  150 - 400 K/uL Final    MPV 05/17/2021 10.3  8.9 - 12.9 FL Final    NRBC 05/17/2021 0.0  0 PER 100 WBC Final    ABSOLUTE NRBC 05/17/2021 0.00  0.00 - 0.01 K/uL Final    NEUTROPHILS 05/17/2021 74  32 - 75 % Final    LYMPHOCYTES 05/17/2021 18  12 - 49 % Final    MONOCYTES 05/17/2021 6  5 - 13 % Final    EOSINOPHILS 05/17/2021 1 (A)  7 % Final    BASOPHILS 05/17/2021 1  0 - 1 % Final    IMMATURE GRANULOCYTES 05/17/2021 0  0 - 0.5 % Final    ABS. NEUTROPHILS 05/17/2021 7.2  1.8 - 8.0 K/UL Final    ABS. LYMPHOCYTES 05/17/2021 1.8  0.8 - 3.5 K/UL Final    ABS. MONOCYTES 05/17/2021 0.6  0.0 - 1.0 K/UL Final    ABS. EOSINOPHILS 05/17/2021 0.1  0.0 - 0.4 K/UL Final    ABS. BASOPHILS 05/17/2021 0.1  0 - 1 K/UL Final    ABS. IMM. GRANS. 05/17/2021 0.0  0.00 - 0.04 K/UL Final    DF 05/17/2021 AUTOMATED    Final    Sodium 05/17/2021 142  136 - 145 mmol/L Final    Potassium 05/17/2021 3.7  3.5 - 5.1 mmol/L Final    Chloride 05/17/2021 102  98 - 107 mmol/L Final     CO2 05/17/2021 27  22 - 29 mmol/L Final    Anion gap 05/17/2021 13  5 - 15 mmol/L Final    Glucose 05/17/2021 108 (A)  65 - 100 mg/dL Final    BUN 05/17/2021 14  6 - 20 MG/DL Final  Creatinine 05/17/2021 0.65  0.50 - 0.90 MG/DL Final    BUN/Creatinine ratio 05/17/2021 22 (A)  12 - 20   Final    eGFR 05/17/2021 >60  >60 ml/min/1.45m Final    Comment:      Pediatric calculator link: https://www.kidney.org/professionals/kdoqi/gfr_calculatorped       These results are not intended for use in patients <144years of age.       eGFR results are calculated without a race factor using  the 2021 CKD-EPI equation. Careful clinical correlation is recommended, particularly when comparing to results calculated using previous equations.  The CKD-EPI equation is less accurate in patients with extremes of muscle mass, extra-renal metabolism of creatinine, excessive creatine ingestion, or following therapy that affects renal tubular secretion.      Calcium 05/17/2021 9.7  8.6 - 10.0 MG/DL Final    D DIMER 05/17/2021 0.29  <0.50 ug/ml(FEU) Final    A D-Dimer result less than 0.5 ug/mL FEU combined with a low clinical pretest probability of DVT and/or PE has a negative predictive value of 90-100%. The positive predictive value is 50% or less.    Troponin-I (POC) 05/18/2021 <0.04  0.00 - 0.08 ng/mL Final    Comment: (NOTE)  The presence of detectable troponin indicates myocardial injury which   may be due to ischemia, myocarditis, trauma, etc. Clinical   correlation is necessary to determine the significance of this   finding. Recommend establishing a baseline troponin using main   clinical laboratory method if serial determinations are anticipated.         IMAGING RESULTS:  XR CHEST PORT   Final Result   No acute process.          MEDICATIONS GIVEN:  Medications - No data to display    IMPRESSION:  1. Chest pain, unspecified type    2. Palpitations        PLAN:  - Discharge    RBevelyn Ngo MD      Medical Decision Making  1F w/  hx HTN, HLD, obesity, thyroid disease p/w 1d chest pain and palpitations. Pt well appearing, afebrile, hemodyanmcially stable w/o resp distress. Ddx includes ACS vs cardiac dysrythmia vs pericarditis vs PNX vs PNA vs bronchitis/COPD/asthma vs CHF vs severe anemia vs HTN emergency/urgency vs gastritis/PUD/GERd vs pleurisy vs costochondritis, less likely PE based on Wells/geneva score and no tachycardia/hypoxia, much less likely esophageal rupture or AD/TAA based on presentation. Ordered CXR, EKG, labs. Monitor and reassess.    0100 Pt feeling well and remains stable. CXR unremarkable. EKG SR w/o ischemia, dysrhythmia or signs of high grade heart block. Trop neg. HEART score=1 (risk factors), low risk for MACE, doubt ACS. Also low risk for PE by wells, ddimer neg, no further VTE work up indicated at this time. Advised to follow up w/ cardiology.    Patient given specific return precautions and explained signs/symptoms for which to come back to ED immediately but otherwise advised to f/u w/ PCP over next 2-3days.    Amount and/or Complexity of Data Reviewed  External Data Reviewed: notes.  Labs: ordered. Decision-making details documented in ED Course.  Radiology: ordered and independent interpretation performed. Decision-making details documented in ED Course.  ECG/medicine tests: ordered and independent interpretation performed. Decision-making details documented in ED Course.           Procedures

## 2021-05-17 NOTE — Progress Notes (Signed)
HPI    Teresa Beasley is a 41 y.o. female and presents today for Other (Pt would like to discuss a few things regarding her hysterectomy )  .  HPI     41 Yo with a hx of HTN, and Obesity s/p total hysterectomy stating heart fluttering x 2 months every 2 to 3 weeks at bedtime about 3 months after being started on Premarin post hysterectomy. Denies chest pain or dyspnea    Allergies    No Known Allergies     Medications    Current Outpatient Medications   Medication Sig Dispense    busPIRone (BUSPAR) 15 mg tablet TAKE 1 TABLET BY MOUTH EVERY DAY AS NEEDED FOR ANXIETY 30 Tablet    ferrous sulfate 325 mg (65 mg iron) tablet TAKE 1 TABLET BY MOUTH DAILY BEFORE BREAKFAST 30 Tablet    atorvastatin (LIPITOR) 20 mg tablet Take 1 Tablet by mouth daily. 90 Tablet    levothyroxine (SYNTHROID) 25 mcg tablet Take 1 Tablet by mouth Daily (before breakfast). Indications: a condition with low thyroid hormone levels 90 Tablet    lisinopril-hydroCHLOROthiazide (PRINZIDE, ZESTORETIC) 20-12.5 mg per tablet TAKE 1 TABLET BY MOUTH EVERY DAY 90 Tablet    Premarin 0.625 mg tablet TAKE 1 TABLET BY MOUTH EVERY DAY 30 Tablet    ALBUTEROL IN Take  by inhalation.     amoxicillin-clavulanate (AUGMENTIN) 875-125 mg per tablet Take  by mouth every twelve (12) hours.     ibuprofen (MOTRIN) 800 mg tablet Take 1 Tablet by mouth every six (6) hours as needed for Pain. 40 Tablet    ciprofloxacin HCl (CIPRO) 250 mg tablet Take 1 Tablet by mouth two (2) times a day. (Patient not taking: No sig reported) 3 Tablet    polyethylene glycol (MIRALAX) 17 gram packet Take 1 Packet by mouth daily. May take 1-2 packets as needed daily for constipation. (Patient not taking: No sig reported) 14 Packet    omeprazole (PRILOSEC) 40 mg capsule Take 40 mg by mouth every morning. (Patient not taking: No sig reported)      No current facility-administered medications for this visit.        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/27/2020    Mixed hyperlipidemia 02/23/2019    Anxiety 11/12/2018    Essential (primary) hypertension 11/12/2018    Obesity (BMI 30-39.9) 11/02/2018    Gastro-esophageal reflux disease with esophagitis 11/02/2018    Generalized anxiety disorder 11/02/2018    Acquired hypothyroidism 11/02/2018    Otalgia of right ear 11/02/2018    URI (upper respiratory infection) 11/02/2018    Hypernatremia 11/02/2018    Ear problems     GERD (gastroesophageal reflux disease)         Family Hx    Family History  Problem Relation Age of Onset    Hypertension Mother     Mult Sclerosis Father     COPD Father     Emphysema Father     Thyroid Disease Sister     Anesth Problems Neg Hx         Social Hx    Social History     Socioeconomic History    Marital status: MARRIED   Tobacco Use    Smoking status: Never    Smokeless tobacco: Never   Vaping Use    Vaping Use: Never used   Substance and Sexual Activity    Alcohol use: Not Currently    Drug use: Never    Sexual activity: Yes     Partners: Male     Birth control/protection: Implant     Comment: nuvaring     Social Determinants of Psychologist, prison and probation services Strain: Low Risk     Difficulty of Paying Living Expenses: Not hard at all   Food Insecurity: No Food Insecurity    Worried About Programme researcher, broadcasting/film/video in the Last Year: Never true    Ran Out of Food in the Last Year: Never true        Surgical Hx    Past Surgical History:   Procedure  Laterality Date    HX CHOLECYSTECTOMY  11/27/2020    HX ENDOSCOPY      HX GYN      HX OVARIAN CYST REMOVAL  2007    OVARIAN CYST & FALLOPIAN TUBE REMOVED BY Dr. Lorna Few WISDOM TEETH EXTRACTION      IR CHOLECYSTOSTOMY PERCUTANEOUS            Vitals    Visit Vitals  BP 138/74 (BP 1 Location: Right upper arm, BP Patient Position: Sitting, BP Cuff Size: Adult)   Pulse 91   Temp 97.9 ??F (36.6 ??C) (Oral)   Resp 16   Ht 5\' 7"  (1.702 m)   Wt 213 lb (96.6 kg)   LMP  (LMP Unknown)   SpO2 97%   BMI 33.36 kg/m??        ROS    Review of Systems   Constitutional: Negative.  Negative for chills and fever.   HENT: Negative.  Negative for congestion, ear discharge, hearing loss, nosebleeds and tinnitus.    Eyes: Negative.  Negative for blurred vision, double vision, photophobia and pain.   Respiratory: Negative.  Negative for cough, hemoptysis and sputum production.    Cardiovascular:  Positive for palpitations. Negative for chest pain.   Gastrointestinal: Negative.  Negative for heartburn, nausea and vomiting.   Genitourinary: Negative.  Negative for dysuria, frequency and urgency.   Musculoskeletal: Negative.  Negative for back pain and myalgias.   Skin: Negative.    Neurological: Negative.  Negative for dizziness, tingling, weakness and headaches.   Endo/Heme/Allergies: Negative.    Psychiatric/Behavioral: Negative.  Negative for depression and suicidal ideas. The patient does not have insomnia.    All other systems reviewed and are negative.      Physical Exam      Physical Exam  Vitals and nursing note reviewed.   Constitutional:       Appearance: Normal appearance. She is obese.   HENT:      Head: Normocephalic and atraumatic.      Right Ear: Tympanic membrane, ear canal and external ear normal.      Left Ear: Tympanic membrane, ear canal and external ear  normal.      Nose: Nose normal.      Mouth/Throat:      Mouth: Mucous membranes are moist.      Pharynx: Oropharynx is clear. No oropharyngeal exudate or posterior  oropharyngeal erythema.   Eyes:      General: No scleral icterus.        Right eye: No discharge.         Left eye: No discharge.      Extraocular Movements: Extraocular movements intact.      Conjunctiva/sclera: Conjunctivae normal.      Pupils: Pupils are equal, round, and reactive to light.   Neck:      Vascular: No carotid bruit.   Cardiovascular:      Rate and Rhythm: Normal rate.      Pulses: Normal pulses.      Heart sounds: Normal heart sounds. No murmur heard.    No gallop.   Pulmonary:      Effort: Pulmonary effort is normal. No respiratory distress.      Breath sounds: Normal breath sounds. No stridor. No wheezing, rhonchi or rales.   Chest:      Chest wall: No tenderness.   Abdominal:      General: Bowel sounds are normal. There is no distension.      Palpations: Abdomen is soft. There is no mass.      Tenderness: There is no abdominal tenderness. There is no right CVA tenderness, left CVA tenderness or rebound.      Hernia: No hernia is present.   Musculoskeletal:         General: No swelling, tenderness, deformity or signs of injury. Normal range of motion.      Cervical back: Normal range of motion and neck supple. No rigidity. No muscular tenderness.      Right lower leg: No edema.      Left lower leg: No edema.   Lymphadenopathy:      Cervical: No cervical adenopathy.   Skin:     General: Skin is warm.      Capillary Refill: Capillary refill takes 2 to 3 seconds.      Coloration: Skin is not jaundiced or pale.      Findings: No bruising, erythema, lesion or rash.   Neurological:      General: No focal deficit present.      Mental Status: She is alert and oriented to person, place, and time.      Cranial Nerves: No cranial nerve deficit.      Sensory: No sensory deficit.      Motor: No weakness.      Coordination: Coordination normal.      Gait: Gait normal.      Deep Tendon Reflexes: Reflexes normal.   Psychiatric:         Mood and Affect: Mood normal.         Behavior: Behavior normal.          Thought Content: Thought content normal.         Judgment: Judgment normal.        Assessment/Plan    Diagnoses and all orders for this visit:    1. 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 S/P total hysterectomy  -     AMB POC EKG ROUTINE W/ 12 LEADS, INTER & REP  -     TSH 3RD GENERATION  -     ESTROGENS, FRACTIONATED    3.  Surgical menopause, symptomatic  -     AMB POC EKG ROUTINE W/ 12 LEADS, INTER & REP  -     TSH 3RD GENERATION  -     ESTROGENS, FRACTIONATED    4. Fluttering heart  -     REFERRAL TO CARDIOLOGY    5. Left atrial enlargement  -     REFERRAL TO CARDIOLOGY    6. Sinus tachycardia  -     REFERRAL TO CARDIOLOGY       Health Maintenance Items reviewed with patient as noted.

## 2021-05-18 ENCOUNTER — Inpatient Hospital Stay
Admit: 2021-05-18 | Discharge: 2021-05-18 | Disposition: A | Discharge status: Home / Self Care | Payer: PRIVATE HEALTH INSURANCE | Attending: Emergency Medicine

## 2021-05-18 LAB — CBC WITH AUTO DIFFERENTIAL
Basophils %: 1 % (ref 0–1)
Basophils Absolute: 0.1 10*3/uL (ref 0–1)
Eosinophils %: 1 % — ABNORMAL LOW
Eosinophils Absolute: 0.1 10*3/uL (ref 0.0–0.4)
Granulocyte Absolute Count: 0 10*3/uL (ref 0.00–0.04)
Hematocrit: 37.7 % (ref 35.0–47.0)
Hemoglobin: 12.7 g/dL (ref 11.5–16.0)
Immature Granulocytes: 0 % (ref 0–0.5)
Lymphocytes %: 18 % (ref 12–49)
Lymphocytes Absolute: 1.8 10*3/uL (ref 0.8–3.5)
MCH: 29.3 PG (ref 26.0–34.0)
MCHC: 33.7 g/dL (ref 30.0–36.5)
MCV: 86.9 FL (ref 80.0–99.0)
MPV: 10.3 FL (ref 8.9–12.9)
Monocytes %: 6 % (ref 5–13)
Monocytes Absolute: 0.6 10*3/uL (ref 0.0–1.0)
NRBC Absolute: 0 10*3/uL (ref 0.00–0.01)
Neutrophils %: 74 % (ref 32–75)
Neutrophils Absolute: 7.2 10*3/uL (ref 1.8–8.0)
Nucleated RBCs: 0 PER 100 WBC
Platelets: 277 10*3/uL (ref 150–400)
RBC: 4.34 M/uL (ref 3.80–5.20)
RDW: 14.6 % — ABNORMAL HIGH (ref 11.5–14.5)
WBC: 9.8 10*3/uL (ref 3.6–11.0)

## 2021-05-18 LAB — BASIC METABOLIC PANEL
Anion Gap: 13 mmol/L (ref 5–15)
BUN: 14 MG/DL (ref 6–20)
Bun/Cre Ratio: 22 — ABNORMAL HIGH (ref 12–20)
CO2: 27 mmol/L (ref 22–29)
Calcium: 9.7 MG/DL (ref 8.6–10.0)
Chloride: 102 mmol/L (ref 98–107)
Creatinine: 0.65 MG/DL (ref 0.50–0.90)
ESTIMATED GLOMERULAR FILTRATION RATE: >60 mL/min/{1.73_m2} (ref >60)
Glucose: 108 mg/dL — ABNORMAL HIGH (ref 65–100)
Potassium: 3.7 mmol/L (ref 3.5–5.1)
Sodium: 142 mmol/L (ref 136–145)

## 2021-05-18 LAB — POC TROPONIN-I
POC Troponin I: <0.04 ng/mL (ref 0.00–0.08)
Troponin-I (POC): <0.04 ng/mL (ref 0.00–0.08)

## 2021-05-18 LAB — METABOLIC PANEL, BASIC
Anion gap: 13 mmol/L (ref 5–15)
BUN/Creatinine ratio: 22 — ABNORMAL HIGH (ref 12–20)
BUN: 14 MG/DL (ref 6–20)
CO2: 27 mmol/L (ref 22–29)
Calcium: 9.7 MG/DL (ref 8.6–10.0)
Chloride: 102 mmol/L (ref 98–107)
Creatinine: 0.65 MG/DL (ref 0.50–0.90)
Glucose: 108 mg/dL — ABNORMAL HIGH (ref 65–100)
Potassium: 3.7 mmol/L (ref 3.5–5.1)
Sodium: 142 mmol/L (ref 136–145)
eGFR: >60 mL/min/{1.73_m2} (ref >60)

## 2021-05-18 LAB — CBC WITH AUTOMATED DIFF
ABS. BASOPHILS: 0.1 10*3/uL (ref 0–1)
ABS. EOSINOPHILS: 0.1 10*3/uL (ref 0.0–0.4)
ABS. IMM. GRANS.: 0 10*3/uL (ref 0.00–0.04)
ABS. LYMPHOCYTES: 1.8 10*3/uL (ref 0.8–3.5)
ABS. MONOCYTES: 0.6 10*3/uL (ref 0.0–1.0)
ABS. NEUTROPHILS: 7.2 10*3/uL (ref 1.8–8.0)
ABSOLUTE NRBC: 0 10*3/uL (ref 0.00–0.01)
BASOPHILS: 1 % (ref 0–1)
EOSINOPHILS: 1 % — ABNORMAL LOW
HCT: 37.7 % (ref 35.0–47.0)
HGB: 12.7 g/dL (ref 11.5–16.0)
IMMATURE GRANULOCYTES: 0 % (ref 0–0.5)
LYMPHOCYTES: 18 % (ref 12–49)
MCH: 29.3 PG (ref 26.0–34.0)
MCHC: 33.7 g/dL (ref 30.0–36.5)
MCV: 86.9 FL (ref 80.0–99.0)
MONOCYTES: 6 % (ref 5–13)
MPV: 10.3 FL (ref 8.9–12.9)
NEUTROPHILS: 74 % (ref 32–75)
NRBC: 0 PER 100 WBC
PLATELET: 277 10*3/uL (ref 150–400)
RBC: 4.34 M/uL (ref 3.80–5.20)
RDW: 14.6 % — ABNORMAL HIGH (ref 11.5–14.5)
WBC: 9.8 10*3/uL (ref 3.6–11.0)

## 2021-05-18 LAB — D DIMER: D DIMER: 0.29 ug/ml(FEU) (ref <0.50)

## 2021-05-18 NOTE — ED Notes (Signed)
Pt.states she is feeling much better. Appears much more relaxed. Appreciative of care given

## 2021-05-18 NOTE — ED Notes (Signed)
 Pt given discharge instructions, patient education,0 prescriptions, and follow up information. Pt verbalizes understanding. All questions answered. Pt discharged to home in private vehicle, ambulatory. Pt A&Ox4, RA, pain controlled.

## 2021-05-19 LAB — EKG 12-LEAD
Atrial Rate: 89 {beats}/min
Diagnosis: NORMAL
P Axis: 52 degrees
P-R Interval: 166 ms
Q-T Interval: 380 ms
QRS Duration: 78 ms
QTc Calculation (Bazett): 462 ms
R Axis: 64 degrees
T Axis: 52 degrees
Ventricular Rate: 89 {beats}/min

## 2021-05-19 LAB — EKG, 12 LEAD, INITIAL
Atrial Rate: 89 {beats}/min
Calculated P Axis: 52 degrees
Calculated R Axis: 64 degrees
Calculated T Axis: 52 degrees
Diagnosis: NORMAL
P-R Interval: 166 ms
Q-T Interval: 380 ms
QRS Duration: 78 ms
QTC Calculation (Bezet): 462 ms
Ventricular Rate: 89 {beats}/min

## 2021-05-20 NOTE — Telephone Encounter (Signed)
The patient is calling to see if Dr.Hyde has any insight on a symptom she is having and taking the Premarin. She reports having tingling and burning sensation under her skin all over. Like something is crawling under her skin. She also is having more hot flashes. Her PCP did not really have an answer for her but did draw some labs to check hormones.

## 2021-05-20 NOTE — Telephone Encounter (Signed)
Has questions using the medicine she is on.

## 2021-05-22 LAB — CBC
Hematocrit: 39.3 % (ref 34.0–46.6)
Hemoglobin: 13 g/dL (ref 11.1–15.9)
MCH: 29.1 pg (ref 26.6–33.0)
MCHC: 33.1 g/dL (ref 31.5–35.7)
MCV: 88 fL (ref 79–97)
Platelets: 294 10*3/uL (ref 150–450)
RBC: 4.46 x10E6/uL (ref 3.77–5.28)
RDW: 15.2 % (ref 11.7–15.4)
WBC: 7.5 10*3/uL (ref 3.4–10.8)

## 2021-05-22 LAB — TSH 3RD GENERATION
TSH: 3.36 u[IU]/mL (ref 0.450–4.500)
TSH: 3.36 u[IU]/mL (ref 0.450–4.500)

## 2021-05-22 LAB — T4, FREE
T4 Free: 1.47 ng/dL (ref 0.82–1.77)
T4, Free: 1.47 ng/dL (ref 0.82–1.77)

## 2021-05-22 LAB — CBC W/O DIFF
HCT: 39.3 % (ref 34.0–46.6)
HGB: 13 g/dL (ref 11.1–15.9)
MCH: 29.1 pg (ref 26.6–33.0)
MCHC: 33.1 g/dL (ref 31.5–35.7)
MCV: 88 fL (ref 79–97)
PLATELET: 294 10*3/uL (ref 150–450)
RBC: 4.46 x10E6/uL (ref 3.77–5.28)
RDW: 15.2 % (ref 11.7–15.4)
WBC: 7.5 10*3/uL (ref 3.4–10.8)

## 2021-05-22 NOTE — Telephone Encounter (Signed)
 Sent letter out to pt with normal lab results.

## 2021-05-22 NOTE — Telephone Encounter (Signed)
 Left a message on patients vm that Dr.Hyde did not think that it is related and has not heard of that side effect. Advised to see her pcp. Advised to call if needed.

## 2021-05-23 LAB — TSH 3RD GENERATION
TSH: 3.28 u[IU]/mL (ref 0.450–4.500)
TSH: 3.28 u[IU]/mL (ref 0.450–4.500)

## 2021-05-23 LAB — ESTROGENS, FRACTIONATED
Estradiol: 22.7 pg/mL
Estrone, Serum: 106 pg/mL (ref 27–231)

## 2021-06-03 MED ORDER — PREMARIN 0.625 MG TABLET
0.625 mg | ORAL_TABLET | ORAL | 2 refills | Status: AC
Start: 2021-06-03 — End: ?

## 2021-06-03 MED ORDER — ESCITALOPRAM 10 MG TAB
10 mg | ORAL_TABLET | Freq: Every day | ORAL | 2 refills | Status: AC
Start: 2021-06-03 — End: 2021-07-03

## 2021-07-29 MED ORDER — BUSPIRONE HCL 15 MG PO TABS
15 MG | ORAL_TABLET | ORAL | 0 refills | Status: AC
Start: 2021-07-29 — End: 2021-09-13

## 2021-08-07 ENCOUNTER — Encounter: Payer: PRIVATE HEALTH INSURANCE | Attending: Family Medicine | Primary: Family Medicine

## 2021-08-07 ENCOUNTER — Encounter
Admit: 2021-08-07 | Discharge: 2021-08-07 | Payer: PRIVATE HEALTH INSURANCE | Attending: Family Medicine | Primary: Family Medicine

## 2021-08-07 DIAGNOSIS — I119 Hypertensive heart disease without heart failure: Secondary | ICD-10-CM

## 2021-08-07 MED ORDER — LISINOPRIL-HYDROCHLOROTHIAZIDE 20-12.5 MG PO TABS
ORAL_TABLET | Freq: Every day | ORAL | 0 refills | Status: AC
Start: 2021-08-07 — End: 2021-11-05

## 2021-08-07 MED ORDER — ATORVASTATIN CALCIUM 20 MG PO TABS
20 MG | ORAL_TABLET | Freq: Every day | ORAL | 0 refills | Status: AC
Start: 2021-08-07 — End: 2021-11-05

## 2021-08-07 NOTE — Progress Notes (Signed)
1. "Have you been to the ER, urgent care clinic since your last visit?  Hospitalized since your last visit?" none    2. "Have you seen or consulted any other health care providers outside of the [facility] since your last visit?" none     3. For patients aged 41-75: Has the patient had a colonoscopy / FIT/ Cologuard? N/A      If the patient is female:    4. For patients aged 49-74: Has the patient had a mammogram within the past 2 years? yes      5. For patients aged 21-65: Has the patient had a pap smear? Yes    Chief Complaint   Patient presents with    Follow-up    Hypertension    Abnormal Hormonal Screen        BP 118/76 (Site: Left Upper Arm, Position: Sitting, Cuff Size: Medium Adult)   Pulse 82   Temp 98 F (36.7 C) (Oral)   Resp 18   Ht 5\' 7"  (1.702 m)   Wt 219 lb 12.8 oz (99.7 kg)   SpO2 98%   BMI 34.43 kg/m      Patient is here for her followup, hypertension, and hormonal issues

## 2021-08-07 NOTE — Progress Notes (Signed)
Teresa Beasley is a 41 y.o. female and presents with Follow-up, Hypertension, and Abnormal Hormonal Screen  .  Hypertension     41 y.o. Patient with a hx of HTN and recent tachycardia and a negative work up by cardiologist here on a routine follow up with no recent symptoms except facial numbness twice weekly  and hand vibrations and bilateral posterial eye pressure and tremor and hot flushes all of which have improved recently  Subjective:  Cardiovascular Review:  The patient has hypertension   Diet and Lifestyle: generally follows a low fat low cholesterol diet, generally follows a low sodium diet, exercises sporadically  Home BP Monitoring: is not measured at home.  Pertinent ROS: taking medications as instructed, no medication side effects noted, no TIA's, no chest pain on exertion, no dyspnea on exertion, no swelling of ankles.     Review of Systems  Review of Systems - Neurological ROS: positive for - numbness/tingling       Past Medical History:   Diagnosis Date    Acquired hypothyroidism 11/02/2018    Anxiety     Bilateral ovarian cysts     Removal 03/24/2010    Ear problems     Essential hypertension 11/02/2018    Gastro-esophageal reflux disease with esophagitis 11/02/2018    Generalized anxiety disorder 11/02/2018    GERD (gastroesophageal reflux disease)     High cholesterol     Hypercholesterolemia     Hypernatremia 11/02/2018    Obesity 11/02/2018    Obesity (BMI 30-39.9) 11/02/2018    Otalgia of right ear 11/02/2018    URI (upper respiratory infection) 11/02/2018     Past Surgical History:   Procedure Laterality Date    CHOLECYSTECTOMY  11/27/2020    GYN      HYSTERECTOMY (CERVIX STATUS UNKNOWN)      IR CHOLECYSTOSTOMY PERCUTANEOUS COMPLETE      OVARIAN CYST REMOVAL  2007    OVARIAN CYST & FALLOPIAN TUBE REMOVED BY Dr. Daphine Deutscher    UPPER GASTROINTESTINAL ENDOSCOPY      WISDOM TOOTH EXTRACTION       Social History     Socioeconomic History    Marital status: Married     Spouse name: None    Number of  children: None    Years of education: None    Highest education level: None   Tobacco Use    Smoking status: Never    Smokeless tobacco: Never   Substance and Sexual Activity    Alcohol use: Not Currently    Drug use: Never     Social Determinants of Health     Financial Resource Strain: Low Risk     Difficulty of Paying Living Expenses: Not hard at all   Food Insecurity: No Food Insecurity    Worried About Programme researcher, broadcasting/film/video in the Last Year: Never true    Barista in the Last Year: Never true   Transportation Needs: Unknown    Lack of Transportation (Non-Medical): No   Housing Stability: Unknown    Unstable Housing in the Last Year: No     Family History   Problem Relation Age of Onset    Anesth Problems Neg Hx     Thyroid Disease Sister     Emphysema Father     COPD Father     Mult Sclerosis Father     Hypertension Mother      Current Outpatient Medications   Medication Sig Dispense Refill  busPIRone (BUSPAR) 15 MG tablet Take 15 mg by mouth      Calcium Carbonate-Vitamin D (OYSTER SHELL CALCIUM/D) 500-5 MG-MCG TABS Take 1 tablet by mouth daily      Cholecalciferol 50 MCG (2000 UT) CAPS Take 50 mcg by mouth daily      estrogens, conjugated, (PREMARIN) 0.625 MG tablet Take 1 tablet by mouth daily      FEROSUL 325 (65 Fe) MG tablet Take 1 tablet by mouth daily      levothyroxine (SYNTHROID) 25 MCG tablet Take 1 tablet by mouth daily Take before breakfast      omeprazole (PRILOSEC) 40 MG delayed release capsule Take 1 capsule by mouth as needed      potassium chloride (KLOR-CON) 10 MEQ extended release tablet Take 1 tablet by mouth daily with food      escitalopram (LEXAPRO) 10 MG tablet Take 1 tablet by mouth daily      atorvastatin (LIPITOR) 20 MG tablet Take 1 tablet by mouth daily 90 tablet 0    lisinopril-hydroCHLOROthiazide (PRINZIDE;ZESTORETIC) 20-12.5 MG per tablet Take 12.5 tablets by mouth daily 90 tablet 0    busPIRone (BUSPAR) 15 MG tablet take 1 tablet by mouth once daily if needed for anxiety  30 tablet 0     No current facility-administered medications for this visit.     No Known Allergies    Objective:  BP 118/76 (Site: Left Upper Arm, Position: Sitting, Cuff Size: Medium Adult)   Pulse 82   Temp 98 F (36.7 C) (Oral)   Resp 18   Ht  (1.702 m)   Wt 219 lb 12.8 oz (99.7 kg)   SpO2 98%   BMI 34.43 kg/m     Physical Exam:   Physical Exam  Vitals and nursing note reviewed.   Constitutional:       Appearance: Normal appearance. She is obese.   HENT:      Head: Normocephalic and atraumatic.      Right Ear: Tympanic membrane, ear canal and external ear normal.      Left Ear: Tympanic membrane, ear canal and external ear normal.      Nose: Nose normal.      Mouth/Throat:      Mouth: Mucous membranes are moist.      Pharynx: Oropharynx is clear.   Eyes:      Extraocular Movements: Extraocular movements intact.      Pupils: Pupils are equal, round, and reactive to light.   Cardiovascular:      Rate and Rhythm: Normal rate and regular rhythm.      Pulses: Normal pulses.      Heart sounds: Normal heart sounds.   Pulmonary:      Effort: Pulmonary effort is normal.      Breath sounds: Normal breath sounds.   Abdominal:      General: Abdomen is flat. Bowel sounds are normal.      Palpations: Abdomen is soft.   Musculoskeletal:         General: Normal range of motion.      Cervical back: Normal range of motion and neck supple.   Skin:     General: Skin is warm and dry.      Capillary Refill: Capillary refill takes less than 2 seconds.   Neurological:      General: No focal deficit present.      Mental Status: She is alert and oriented to person, place, and time. Mental status is at baseline.  Psychiatric:         Mood and Affect: Mood normal.         Behavior: Behavior normal.         Thought Content: Thought content normal.         Judgment: Judgment normal.            No results found for this visit on 08/07/21.    Assessment/Plan:    ICD-10-CM    1. Malignant hypertensive heart disease without heart  failure  I11.9       2. Gastroesophageal reflux disease with esophagitis without hemorrhage  K21.00       3. Generalized anxiety disorder  F41.1       4. Neuropathy  G62.9 [facility] - Kirstie Mirza, MD, Neurology, [place]      5. Bilateral carpal tunnel syndrome  G56.03 [facility] - Epps, Stacey, MD, Neurology, [place]        No orders of the defined types were placed in this encounter.    Cannot display discharge medications since this is not an admission.

## 2021-08-08 MED ORDER — FEROSUL 325 (65 FE) MG PO TABS
325 (65 Fe) MG | ORAL_TABLET | Freq: Every day | ORAL | 1 refills | Status: AC
Start: 2021-08-08 — End: 2021-09-07

## 2021-08-08 MED ORDER — FEROSUL 325 (65 FE) MG PO TABS
32565 (65 Fe) MG | ORAL_TABLET | ORAL | 1 refills | Status: AC
Start: 2021-08-08 — End: 2021-09-07

## 2021-08-08 NOTE — Progress Notes (Signed)
Sent over to the pharmacy the prescription Ferrous sulfate

## 2021-08-23 ENCOUNTER — Encounter: Attending: Family Medicine | Primary: Family Medicine

## 2021-08-30 MED ORDER — ESCITALOPRAM OXALATE 10 MG PO TABS
10 MG | ORAL_TABLET | ORAL | 0 refills | Status: AC
Start: 2021-08-30 — End: 2021-09-30

## 2021-09-06 MED ORDER — PREMARIN 0.625 MG PO TABS
0.625 MG | ORAL_TABLET | ORAL | 5 refills | Status: AC
Start: 2021-09-06 — End: 2022-02-25

## 2021-09-06 MED ORDER — LISINOPRIL-HYDROCHLOROTHIAZIDE 20-12.5 MG PO TABS
20-12.5 MG | ORAL_TABLET | ORAL | 0 refills | Status: AC
Start: 2021-09-06 — End: 2021-12-18

## 2021-09-13 MED ORDER — BUSPIRONE HCL 15 MG PO TABS
15 MG | ORAL_TABLET | ORAL | 0 refills | Status: DC
Start: 2021-09-13 — End: 2021-10-14

## 2021-09-18 ENCOUNTER — Encounter: Attending: Neurology | Primary: Family Medicine

## 2021-09-30 NOTE — Telephone Encounter (Signed)
Patient also needs a refill on levothyroxine 25 mg

## 2021-10-01 MED ORDER — ESCITALOPRAM OXALATE 10 MG PO TABS
10 MG | ORAL_TABLET | ORAL | 0 refills | Status: DC
Start: 2021-10-01 — End: 2021-11-01

## 2021-10-04 MED ORDER — FEROSUL 325 (65 FE) MG PO TABS
325 (65 Fe) MG | ORAL_TABLET | Freq: Every day | ORAL | 1 refills | Status: AC
Start: 2021-10-04 — End: 2021-11-03

## 2021-10-15 MED ORDER — BUSPIRONE HCL 15 MG PO TABS
15 MG | ORAL_TABLET | ORAL | 0 refills | Status: DC
Start: 2021-10-15 — End: 2021-11-12

## 2021-11-01 MED ORDER — ESCITALOPRAM OXALATE 10 MG PO TABS
10 MG | ORAL_TABLET | ORAL | 0 refills | Status: DC
Start: 2021-11-01 — End: 2021-12-02

## 2021-11-01 NOTE — Telephone Encounter (Signed)
Requested Prescriptions     Pending Prescriptions Disp Refills    escitalopram (LEXAPRO) 10 MG tablet [Pharmacy Med Name: ESCITALOPRAM 10 MG TABLET] 30 tablet 0     Sig: take 1 tablet by mouth once daily       Allergies:  No Known Allergies    Last visit with ordering provider: 08/07/2021   Next visit with ordering provider: 11/06/2021       Current Outpatient Medications   Medication Instructions    atorvastatin (LIPITOR) 20 mg, Oral, DAILY    busPIRone (BUSPAR) 15 MG tablet take 1 tablet by mouth once daily if needed for anxiety    busPIRone (BUSPAR) 15 mg, Oral    Calcium Carbonate-Vitamin D (OYSTER SHELL CALCIUM/D) 500-5 MG-MCG TABS 1 tablet, Oral, DAILY    escitalopram (LEXAPRO) 10 MG tablet take 1 tablet by mouth once daily    FEROSUL 325 (65 Fe) MG tablet take 1 tablet by mouth once daily before breakfast    FeroSul 325 mg, Oral, DAILY    levothyroxine (SYNTHROID) 25 mcg, Oral, DAILY, Take before breakfast    lisinopril-hydroCHLOROthiazide (PRINZIDE;ZESTORETIC) 20-12.5 MG per tablet TAKE 1 TABLET BY MOUTH EVERY DAY    omeprazole (PRILOSEC) 40 mg, Oral, PRN    potassium chloride (KLOR-CON) 10 MEQ extended release tablet 10 mEq, Oral, DAILY, with food    PREMARIN 0.625 MG tablet take 1 tablet by mouth once daily    vitamin D (CHOLECALCIFEROL) 50 mcg, Oral, DAILY       Signed by Shelly Coss CCT  11/01/21  9:04 AM

## 2021-11-06 ENCOUNTER — Ambulatory Visit
Admit: 2021-11-06 | Discharge: 2021-11-06 | Payer: PRIVATE HEALTH INSURANCE | Attending: Family Medicine | Primary: Family Medicine

## 2021-11-06 DIAGNOSIS — I119 Hypertensive heart disease without heart failure: Secondary | ICD-10-CM

## 2021-11-06 NOTE — Progress Notes (Signed)
1. "Have you been to the ER, urgent care clinic since your last visit?  Hospitalized since your last visit?" No     2. "Have you seen or consulted any other health care providers outside of the [facility] since your last visit?" No      3. For patients aged 41-75: Has the patient had a colonoscopy / FIT/ Cologuard? N/A      If the patient is female:    4. For patients aged 62-74: Has the patient had a mammogram within the past 2 years? Yes no care gap present       5. For patients aged 21-65: Has the patient had a pap smear? Yes no care gap present       Chief Complaint   Patient presents with    Follow-up Chronic Condition     BP 119/80 (Site: Right Upper Arm, Position: Sitting, Cuff Size: Large Adult)   Pulse 99   Temp 98.3 F (36.8 C) (Oral)   Resp 16   Ht 5\' 7"  (1.702 m)   Wt 231 lb 6.4 oz (105 kg)   SpO2 97%   BMI 36.24 kg/m        Pt is here for a follow up.

## 2021-11-06 NOTE — Progress Notes (Signed)
Teresa Beasley is a 41 y.o. female and presents with Follow-up Chronic Condition  .  HPI with a hx of HTN and Hypothyroidism  41 y.o. Patient here on a routine follow up with no recent symptoms and states marked improvement in anxiety and depression after we started lexapro    Subjective:  Cardiovascular Review:  The patient has hypertension   Diet and Lifestyle: generally follows a low fat low cholesterol diet, generally follows a low sodium diet, exercises sporadically  Home BP Monitoring: is not measured at home.  Pertinent ROS: taking medications as instructed, no medication side effects noted, no TIA's, no chest pain on exertion, no dyspnea on exertion, no swelling of ankles.     Review of Systems  Review of Systems - Endocrine ROS: positive for - hot flashes       Past Medical History:   Diagnosis Date    Acquired hypothyroidism 11/02/2018    Anxiety     Bilateral ovarian cysts     Removal 03/24/2010    Ear problems     Essential hypertension 11/02/2018    Gastro-esophageal reflux disease with esophagitis 11/02/2018    Generalized anxiety disorder 11/02/2018    GERD (gastroesophageal reflux disease)     High cholesterol     Hypercholesterolemia     Hypernatremia 11/02/2018    Obesity 11/02/2018    Obesity (BMI 30-39.9) 11/02/2018    Otalgia of right ear 11/02/2018    URI (upper respiratory infection) 11/02/2018     Past Surgical History:   Procedure Laterality Date    CHOLECYSTECTOMY  11/27/2020    GYN      HYSTERECTOMY (CERVIX STATUS UNKNOWN)      IR CHOLECYSTOSTOMY PERCUTANEOUS COMPLETE      OVARIAN CYST REMOVAL  2007    OVARIAN CYST & FALLOPIAN TUBE REMOVED BY Dr. Daphine Deutscher    UPPER GASTROINTESTINAL ENDOSCOPY      WISDOM TOOTH EXTRACTION       Social History     Socioeconomic History    Marital status: Married     Spouse name: None    Number of children: None    Years of education: None    Highest education level: None   Tobacco Use    Smoking status: Never    Smokeless tobacco: Never   Substance and Sexual  Activity    Alcohol use: Not Currently    Drug use: Never     Social Determinants of Health     Financial Resource Strain: Low Risk     Difficulty of Paying Living Expenses: Not hard at all   Food Insecurity: No Food Insecurity    Worried About Programme researcher, broadcasting/film/video in the Last Year: Never true    Barista in the Last Year: Never true   Transportation Needs: Unknown    Lack of Transportation (Non-Medical): No   Housing Stability: Unknown    Unstable Housing in the Last Year: No     Family History   Problem Relation Age of Onset    Anesth Problems Neg Hx     Thyroid Disease Sister     Emphysema Father     COPD Father     Mult Sclerosis Father     Hypertension Mother      Current Outpatient Medications   Medication Sig Dispense Refill    escitalopram (LEXAPRO) 10 MG tablet take 1 tablet by mouth once daily 30 tablet 0    PREMARIN 0.625 MG tablet take 1  tablet by mouth once daily 30 tablet 5    lisinopril-hydroCHLOROthiazide (PRINZIDE;ZESTORETIC) 20-12.5 MG per tablet TAKE 1 TABLET BY MOUTH EVERY DAY 90 tablet 0    busPIRone (BUSPAR) 15 MG tablet Take 15 mg by mouth      Calcium Carbonate-Vitamin D (OYSTER SHELL CALCIUM/D) 500-5 MG-MCG TABS Take 1 tablet by mouth daily      Cholecalciferol 50 MCG (2000 UT) CAPS Take 50 mcg by mouth daily      levothyroxine (SYNTHROID) 25 MCG tablet Take 1 tablet by mouth daily Take before breakfast      omeprazole (PRILOSEC) 40 MG delayed release capsule Take 1 capsule by mouth as needed      potassium chloride (KLOR-CON) 10 MEQ extended release tablet Take 1 tablet by mouth daily with food      busPIRone (BUSPAR) 15 MG tablet take 1 tablet by mouth once daily if needed for anxiety (Patient not taking: Reported on 11/06/2021) 30 tablet 0    FEROSUL 325 (65 Fe) MG tablet take 1 tablet by mouth daily 60 tablet 1    FEROSUL 325 (65 Fe) MG tablet take 1 tablet by mouth once daily before breakfast 30 tablet 1    atorvastatin (LIPITOR) 20 MG tablet Take 1 tablet by mouth daily 90 tablet  0     No current facility-administered medications for this visit.     No Known Allergies    Objective:  BP 119/80 (Site: Right Upper Arm, Position: Sitting, Cuff Size: Large Adult)   Pulse 99   Temp 98.3 F (36.8 C) (Oral)   Resp 16   Ht 5\' 7"  (1.702 m)   Wt 231 lb 6.4 oz (105 kg)   SpO2 97%   BMI 36.24 kg/m     Physical Exam:   Physical Exam  Vitals and nursing note reviewed.   Constitutional:       Appearance: Normal appearance. She is obese.   HENT:      Head: Normocephalic and atraumatic.      Right Ear: Tympanic membrane, ear canal and external ear normal.      Left Ear: Tympanic membrane, ear canal and external ear normal.      Nose: Nose normal.      Mouth/Throat:      Mouth: Mucous membranes are moist.      Pharynx: Oropharynx is clear.   Eyes:      Extraocular Movements: Extraocular movements intact.      Pupils: Pupils are equal, round, and reactive to light.   Cardiovascular:      Rate and Rhythm: Normal rate and regular rhythm.      Pulses: Normal pulses.      Heart sounds: Normal heart sounds.   Pulmonary:      Effort: Pulmonary effort is normal.      Breath sounds: Normal breath sounds.   Abdominal:      General: Abdomen is flat. Bowel sounds are normal.      Palpations: Abdomen is soft.   Musculoskeletal:         General: Normal range of motion.      Cervical back: Normal range of motion and neck supple.   Skin:     General: Skin is warm and dry.      Capillary Refill: Capillary refill takes less than 2 seconds.   Neurological:      General: No focal deficit present.      Mental Status: She is alert and oriented to person, place, and  time. Mental status is at baseline.   Psychiatric:         Mood and Affect: Mood normal.         Behavior: Behavior normal.         Thought Content: Thought content normal.         Judgment: Judgment normal.            No results found for this visit on 11/06/21.    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 Left atrial enlargement  I51.7       3. Gastroesophageal reflux disease with esophagitis without hemorrhage  K21.00       4. Obesity (BMI 30-39.9)  E66.9       5. Anxiety  F41.9       6. Generalized anxiety disorder  F41.1       7. Mixed hyperlipidemia  E78.2       8. Iron deficiency anemia secondary to inadequate dietary iron intake  D50.8       9. Acquired hypothyroidism  E03.9         No orders of the defined types were placed in this encounter.    Cannot display discharge medications since this is not an admission.

## 2021-11-12 MED ORDER — BUSPIRONE HCL 15 MG PO TABS
15 MG | ORAL_TABLET | ORAL | 0 refills | Status: DC
Start: 2021-11-12 — End: 2021-12-18

## 2021-12-02 MED ORDER — ESCITALOPRAM OXALATE 10 MG PO TABS
10 MG | ORAL_TABLET | ORAL | 0 refills | Status: AC
Start: 2021-12-02 — End: ?

## 2021-12-18 MED ORDER — LISINOPRIL-HYDROCHLOROTHIAZIDE 20-12.5 MG PO TABS
ORAL_TABLET | Freq: Every day | ORAL | 0 refills | Status: DC
Start: 2021-12-18 — End: 2022-04-09

## 2021-12-18 MED ORDER — BUSPIRONE HCL 15 MG PO TABS
15 MG | ORAL_TABLET | ORAL | 0 refills | Status: AC
Start: 2021-12-18 — End: 2022-01-21

## 2022-01-01 MED ORDER — ESCITALOPRAM OXALATE 10 MG PO TABS
10 MG | ORAL_TABLET | ORAL | 0 refills | Status: AC
Start: 2022-01-01 — End: 2022-01-31

## 2022-01-06 ENCOUNTER — Ambulatory Visit
Admit: 2022-01-06 | Discharge: 2022-03-06 | Payer: PRIVATE HEALTH INSURANCE | Attending: Neurology | Primary: Family Medicine

## 2022-01-06 DIAGNOSIS — R200 Anesthesia of skin: Secondary | ICD-10-CM

## 2022-01-06 MED ORDER — DIAZEPAM 10 MG PO TABS
10 MG | ORAL_TABLET | ORAL | 0 refills | Status: DC
Start: 2022-01-06 — End: 2022-04-09

## 2022-01-06 NOTE — Telephone Encounter (Signed)
Patitne is requesting a refill on atorvastatin (LIPITOR) 20 MG tablet   She has been out for a while

## 2022-01-06 NOTE — Progress Notes (Signed)
[facility] [address] [place], [place] [address]  [address] [place], [place] [address]  [phone] Office  [phone] Facsimile           Referring: Lawrence Santiago, MD  [address]  [place],  [place] [address]     Chief Complaint   Patient presents with    New Patient     Patient was referred by Dr. Dorthea Cove for tingling, burning sensations over body,mostly on her arms and legs. Patient reports that it started about jan 2023. It comes and goes.      41 year old lady who presents today for neurologic consultation accompanied by her husband for complaints of abnormal sensations bilaterally.  She notes this started in January without inciting factor.  She will have numbness tingling sensation in her bilateral arms and legs.  She at times feels like a bee sting.  It will include her head at times.  I will include her chest.  She sometimes feels like her skin is crawling or vibrating.  She started on some antianxiety medicine and that may have made a positive impact but she still gets the abnormal sensations.  The first time it came it lasted 1-2 weeks.  Now it comes and goes without inciting factor.  Nothing she can do makes it better or worse.  She has seen an endocrinologist.  She had all of her vitamin levels checked.  She is on B12 supplementation.  Her thyroid is in line.  Her father has primary progressive MS.  She works as a Armed forces operational officer.  She has a previous diagnosis of carpal tunnel syndrome back when she lived in [place] with EMG nerve conduction about 8 to 10 years ago.  She has a Armed forces operational officer so she does a lot of work with her hands.  And that since she says she has weakness of her left upper and lower extremity.  She notes this gets worse towards the end of the week after she has been working all week.  No etiology except for the carpal  tunnel was given.  She has never had any focal neurologic deficit that has persisted and then resolved.  At times she has a vibrating type sensation as well.    Dr. Dorthea Cove office visit dated August 16 of this year where patient came in for routine follow-up of anxiety and depression.  She was on Lexapro.  She was said to have generalized anxiety, dyslipidemia, iron deficiency anemia, hypothyroidism, left atrial enlargement, GERD as well as malignant hypertensive heart disease without heart failure.    Laboratory analysis from February of this year  TSH unremarkable  Free T4 unremarkable  CBC unremarkable  Troponin normal  Basic metabolic panel unremarkable  Liver function unremarkable      Past Medical History:   Diagnosis Date    Acquired hypothyroidism 11/02/2018    Anxiety     Bilateral ovarian cysts     Removal 03/24/2010    Ear problems     Essential hypertension 11/02/2018    Gastro-esophageal reflux disease with esophagitis 11/02/2018    Generalized anxiety disorder 11/02/2018    GERD (gastroesophageal reflux disease)     High cholesterol     Hypercholesterolemia     Hypernatremia 11/02/2018    Obesity 11/02/2018    Obesity (BMI 30-39.9) 11/02/2018    Otalgia of right ear 11/02/2018  URI (upper respiratory infection) 11/02/2018       Past Surgical History:   Procedure Laterality Date    CHOLECYSTECTOMY  11/27/2020    GYN      HYSTERECTOMY (CERVIX STATUS UNKNOWN)      IR CHOLECYSTOSTOMY PERCUTANEOUS COMPLETE      OVARIAN CYST REMOVAL  2007    OVARIAN CYST & FALLOPIAN TUBE REMOVED BY Dr. Daphine Deutscher    UPPER GASTROINTESTINAL ENDOSCOPY      WISDOM TOOTH EXTRACTION         Current Outpatient Medications   Medication Sig Dispense Refill    escitalopram (LEXAPRO) 10 MG tablet take 1 tablet by mouth once daily 30 tablet 0    busPIRone (BUSPAR) 15 MG tablet take 1 tablet by mouth once daily if needed for anxiety 30 tablet 0    lisinopril-hydroCHLOROthiazide (PRINZIDE;ZESTORETIC) 20-12.5 MG per tablet take 1 tablet by  mouth once daily 90 tablet 0    PREMARIN 0.625 MG tablet take 1 tablet by mouth once daily 30 tablet 5    busPIRone (BUSPAR) 15 MG tablet Take 15 mg by mouth      Calcium Carbonate-Vitamin D (OYSTER SHELL CALCIUM/D) 500-5 MG-MCG TABS Take 1 tablet by mouth daily      Cholecalciferol 50 MCG (2000 UT) CAPS Take 50 mcg by mouth daily      levothyroxine (SYNTHROID) 25 MCG tablet Take 1 tablet by mouth daily Take before breakfast      omeprazole (PRILOSEC) 40 MG delayed release capsule Take 1 capsule by mouth as needed      potassium chloride (KLOR-CON) 10 MEQ extended release tablet Take 1 tablet by mouth daily with food      atorvastatin (LIPITOR) 20 MG tablet Take 1 tablet by mouth daily 90 tablet 0    FEROSUL 325 (65 Fe) MG tablet take 1 tablet by mouth daily 60 tablet 1    FEROSUL 325 (65 Fe) MG tablet take 1 tablet by mouth once daily before breakfast 30 tablet 1     No current facility-administered medications for this visit.        No Known Allergies    Social History     Tobacco Use    Smoking status: Never    Smokeless tobacco: Never   Substance Use Topics    Alcohol use: Not Currently    Drug use: Never       Family History   Problem Relation Age of Onset    Anesth Problems Neg Hx     Thyroid Disease Sister     Emphysema Father     COPD Father     Mult Sclerosis Father     Hypertension Mother        Examination  BP 125/78 (Site: Left Upper Arm, Position: Sitting, Cuff Size: Large Adult)   Pulse 88   Resp 18   Ht 5\' 7"  (1.702 m)   Wt 220 lb (99.8 kg)   SpO2 97%   BMI 34.46 kg/m   Neurologically, she is awake, alert, and oriented with normal speech and language.  Her cognition is normal.    Intact cranial nerves 2-12.  No nystagmus. She has normal bulk and tone.  She has no abnormal movement.  She has no pronation or drift.  She generates full strength in the upper and lower extremities to direct confrontational testing.  Reflexes are symmetrical in the upper and lower extremities bilaterally.  No  pathologic reflexes are elicited.  Finger nose finger and rapid alternating  movements are normal.      Impression/Plan  41 year old lady with bilateral paresthesias including the trunk as well as the head with the first incident lasting 1-2 weeks consistently and now intermittent with no inciting factor and also perceived left upper and lower extremity weakness.  We will evaluate for central etiology such as demyelinating disease with MRI of the brain.  We will evaluate for peripheral abnormalities with EMG of the upper and lower extremities  Further dependent upon results of these tests  Follow-up after    Debbrah Alar, MD          This note was created using voice recognition software. Despite editing, there may be syntax errors.

## 2022-01-07 MED ORDER — ATORVASTATIN CALCIUM 20 MG PO TABS
20 MG | ORAL_TABLET | Freq: Every day | ORAL | 0 refills | Status: DC
Start: 2022-01-07 — End: 2022-04-24

## 2022-01-13 DIAGNOSIS — R202 Paresthesia of skin: Secondary | ICD-10-CM

## 2022-01-13 DIAGNOSIS — R200 Anesthesia of skin: Secondary | ICD-10-CM

## 2022-01-14 ENCOUNTER — Inpatient Hospital Stay: Admit: 2022-01-14 | Payer: BLUE CROSS/BLUE SHIELD | Attending: Neurology | Primary: Family Medicine

## 2022-01-14 MED ORDER — GADOTERIDOL 279.3 MG/ML IV SOLN
279.3 MG/ML | Freq: Once | INTRAVENOUS | Status: AC
Start: 2022-01-14 — End: 2022-01-13
  Administered 2022-01-14: 01:00:00 20 mL via INTRAVENOUS

## 2022-01-14 MED FILL — PROHANCE 279.3 MG/ML IV SOLN: 279.3 MG/ML | INTRAVENOUS | Qty: 20

## 2022-01-21 MED ORDER — FEROSUL 325 (65 FE) MG PO TABS
32565 (65 Fe) MG | ORAL_TABLET | Freq: Every day | ORAL | 1 refills | Status: DC
Start: 2022-01-21 — End: 2022-05-19

## 2022-01-21 MED ORDER — BUSPIRONE HCL 15 MG PO TABS
15 MG | ORAL_TABLET | ORAL | 0 refills | Status: DC
Start: 2022-01-21 — End: 2022-02-26

## 2022-01-29 ENCOUNTER — Ambulatory Visit: Admit: 2022-01-29 | Payer: BLUE CROSS/BLUE SHIELD | Primary: Family Medicine

## 2022-01-29 DIAGNOSIS — R200 Anesthesia of skin: Secondary | ICD-10-CM

## 2022-01-29 NOTE — Progress Notes (Signed)
EMG/ NCS Report  [facility]  [address] [place], [place]  [address]   Ph: [phone]   FAX: [phone]    Test Date:  01/29/2022    Patient: Teresa Beasley, Teresa Beasley DOB: 12/24/1981 Physician: Elisha Headland MD   ID#: [id number] SEX: Female Ref. Phys: Debbrah Alar MD   Tech: Jamie Kato    Patient History / Exam:  Patient complaining of bilateral arm numbness and tingling since 03/2021. Exam is non-focal. Assess for neuropathy vs radiculopathy.    EMG & NCV Findings:  Sensory and motor nerve conduction studies (as indicated in the tables) were within reference of normal.    All F Wave latencies were within normal limits.  All F Wave left vs. right side latency differences were within normal limits.      Disposable concentric needle EMG (as indicated in the table) showed no evidence of electrical instability.      Impressions:   This study is normal.  There is no electrodiagnostic evidence of an entrapment neuropathy, generalized neuropathy, myopathy or significant cervical radiculopathy at this time.    Thank you for the consult.     Elisha Headland MD    Nerve Conduction Studies  Anti Sensory Summary Table     Stim Site NR Peak (ms) Norm Peak (ms) P-T Amp (V) Norm P-T Amp Site1 Site2 Dist (cm)   Left Median Anti Sensory (2nd Digit)  29.9 C   Wrist    3.3 <4 70.9 >13 Wrist 2nd Digit 14.0   Right Median Anti Sensory (2nd Digit)  30.8 C   Wrist    3.3 <4 25.8 >13 Wrist 2nd Digit 14.0   Left Radial Anti Sensory (Base 1st Digit)  31.3 C   Wrist    2.1 <2.8 31.8 >11 Wrist Base 1st Digit 10.0   Right Radial Anti Sensory (Base 1st Digit)  32.1 C   Wrist    1.8 <2.8 69.4 >11 Wrist Base 1st Digit 10.0   Left Ulnar Anti Sensory (5th Digit)  30.9 C   Wrist    3.3 <4.0 46.6 >9 Wrist 5th Digit 14.0   Right Ulnar Anti Sensory (5th Digit)  31.9 C   Wrist    2.8 <4.0 52.5 >9 Wrist 5th Digit 14.0     Motor Summary Table     Stim Site NR Onset  (ms) Norm Onset (ms) O-P Amp (mV) Norm O-P Amp Amp (Prev) (%) Site1 Site2 Dist (cm) Vel (m/s) Norm Vel (m/s)   Left Median Motor (Abd Poll Brev)  31.7 C   Wrist    3.6 <4.5 11.0 >4.1 100.0 Wrist Abd Poll Brev 8.0     Elbow    7.2  10.2  92.7 Elbow Wrist 18.0 50 >49   Right Median Motor (Abd Poll Brev)  32.3 C   Wrist    3.7 <4.5 13.7 >4.1 100.0 Wrist Abd Poll Brev 8.0     Elbow    7.5  12.1  88.3 Elbow Wrist 19.0 50 >49   Left Ulnar Motor (Abd Dig Minimi)  31.8 C   Wrist    2.8 <3.1 11.3 >7.0 100.0 Wrist Abd Dig Minimi 8.0  >50   B Elbow    6.3  11.0  97.3 B Elbow Wrist 20.0 57 >50   A Elbow    8.1  11.7  106.4 A Elbow B Elbow 10.0 56 >50   Right Ulnar Motor (Abd Dig Minimi)  32.3 C   Wrist    2.8 <3.1 14.5 >7.0 100.0 Wrist Abd Dig Minimi 8.0  >50   B Elbow    6.2  11.9  82.1 B Elbow Wrist 19.0 56 >50   A Elbow    7.9  12.2  102.5 A Elbow B Elbow 10.0 59 >50     Comparison Summary Table     Stim Site NR Peak (ms) P-T Amp (V) Site1 Site2 Dist (cm) Delta-0 (ms)   Left Median/Ulnar Palm Comparison (Wrist)  32.2 C   Median Palm    1.9 44.9 Median Palm Ulnar Palm 8.0 0.4   Ulnar Palm    1.7 13.8       Right Median/Ulnar Palm Comparison (Wrist)  32.5 C   Median Palm    1.9 45.3 Median Palm Ulnar Palm 8.0 0.3   Ulnar Palm    1.7 9.9         F Wave Studies     NR F-Lat (ms) Lat Norm (ms) L-R F-Lat (ms) L-R Lat Norm   Left Ulnar (Mrkrs) (Abd Dig Min)  31.8 C      27.43 <32 0.00 <2.5   Right Ulnar (Mrkrs) (Abd Dig Min)  32.4 C      27.43 <32 0.00 <2.5       EMG     Side Muscle Nerve Root Ins Act Fibs Psw Recrt Duration Amp Poly Comment   Left Deltoid Axillary C5-6 Nml Nml Nml Nml Nml Nml Nml    Left Triceps Radial C6-7-8 Nml Nml Nml Nml Nml Nml Nml    Left Biceps Musculocut C5-6 Nml Nml Nml Nml Nml Nml Nml    Left PronatorTeres Median C6-7 Nml Nml Nml Nml Nml Nml Nml    Left 1stDorInt Ulnar C8-T1 Nml Nml Nml Nml Nml Nml Nml    Left Lower Cerv Parasp Rami C7,T1 Nml Nml Nml Nml Nml Nml Nml    Left Mid Cerv Parasp Rami  C5,6 Nml Nml Nml Nml Nml Nml Nml    Right Deltoid Axillary C5-6 Nml Nml Nml Nml Nml Nml Nml    Right Triceps Radial C6-7-8 Nml Nml Nml Nml Nml Nml Nml    Right Biceps Musculocut C5-6 Nml Nml Nml Nml Nml Nml Nml    Right PronatorTeres Median C6-7 Nml Nml Nml Nml Nml Nml Nml    Right 1stDorInt Ulnar C8-T1 Nml Nml Nml Nml Nml Nml Nml    Right Lower Cerv Parasp Rami C7,T1 Nml Nml Nml Nml Nml Nml Nml    Right Mid Cerv Parasp Rami C5,6 Nml Nml Nml Nml Nml Nml Nml      Waveforms:

## 2022-01-31 MED ORDER — ESCITALOPRAM OXALATE 10 MG PO TABS
10 MG | ORAL_TABLET | ORAL | 0 refills | Status: AC
Start: 2022-01-31 — End: 2022-02-26

## 2022-02-05 ENCOUNTER — Encounter
Admit: 2022-02-05 | Discharge: 2022-02-05 | Payer: BLUE CROSS/BLUE SHIELD | Attending: Family Medicine | Primary: Family Medicine

## 2022-02-05 DIAGNOSIS — I119 Hypertensive heart disease without heart failure: Secondary | ICD-10-CM

## 2022-02-05 NOTE — Progress Notes (Signed)
Teresa Beasley is a 41 y.o. female and presents with Follow-up  .  HPI with a hx of HTN and Hypothyroidism  41 y.o. Patient here on a routine follow up with no recent symptoms except bilateral electricallike shocks in both arms ,legs and spine that is improved with antianxiety meds here-    Subjective:  Cardiovascular Review:  The patient has hypertension   Diet and Lifestyle: generally follows a low fat low cholesterol diet, generally follows a low sodium diet, exercises sporadically  Home BP Monitoring: is not measured at home.  Pertinent ROS: taking medications as instructed, no medication side effects noted, no TIA's, no chest pain on exertion, no dyspnea on exertion, no swelling of ankles.     Review of Systems  Review of Systems - Neurological ROS: positive for - numbness/tingling       Past Medical History:   Diagnosis Date    Acquired hypothyroidism 11/02/2018    Anxiety     Bilateral ovarian cysts     Removal 03/24/2010    Ear problems     Essential hypertension 11/02/2018    Gastro-esophageal reflux disease with esophagitis 11/02/2018    Generalized anxiety disorder 11/02/2018    GERD (gastroesophageal reflux disease)     High cholesterol     Hypercholesterolemia     Hypernatremia 11/02/2018    Obesity 11/02/2018    Obesity (BMI 30-39.9) 11/02/2018    Otalgia of right ear 11/02/2018    URI (upper respiratory infection) 11/02/2018     Past Surgical History:   Procedure Laterality Date    CHOLECYSTECTOMY  11/27/2020    GYN      HYSTERECTOMY (CERVIX STATUS UNKNOWN)      IR CHOLECYSTOSTOMY PERCUTANEOUS COMPLETE      OVARIAN CYST REMOVAL  2007    OVARIAN CYST & FALLOPIAN TUBE REMOVED BY Dr. Daphine Deutscher    UPPER GASTROINTESTINAL ENDOSCOPY      WISDOM TOOTH EXTRACTION       Social History     Socioeconomic History    Marital status: Married     Spouse name: None    Number of children: None    Years of education: None    Highest education level: None   Tobacco Use    Smoking status: Never    Smokeless tobacco: Never    Substance and Sexual Activity    Alcohol use: Not Currently    Drug use: Never     Social Determinants of Health     Financial Resource Strain: Low Risk  (08/07/2021)    Overall Financial Resource Strain (CARDIA)     Difficulty of Paying Living Expenses: Not hard at all   Food Insecurity: No Food Insecurity (08/07/2021)    Hunger Vital Sign     Worried About Radiation protection practitioner of Food in the Last Year: Never true     Ran Out of Food in the Last Year: Never true   Transportation Needs: Unknown (08/07/2021)    PRAPARE - Transportation     Lack of Transportation (Non-Medical): No   Housing Stability: Unknown (08/07/2021)    Housing Stability Vital Sign     Unstable Housing in the Last Year: No     Family History   Problem Relation Age of Onset    Anesth Problems Neg Hx     Thyroid Disease Sister     Emphysema Father     COPD Father     Mult Sclerosis Father     Hypertension Mother  Current Outpatient Medications   Medication Sig Dispense Refill    escitalopram (LEXAPRO) 10 MG tablet take 1 tablet by mouth once daily 30 tablet 0    busPIRone (BUSPAR) 15 MG tablet take 1 tablet by mouth once daily if needed for anxiety 30 tablet 0    FEROSUL 325 (65 Fe) MG tablet take 1 tablet by mouth once daily 60 tablet 1    atorvastatin (LIPITOR) 20 MG tablet take 1 tablet by mouth once daily 90 tablet 0    diazePAM (VALIUM) 10 MG tablet 1 po 45 minutes prior to MRI  --needs driver 1 tablet 0    lisinopril-hydroCHLOROthiazide (PRINZIDE;ZESTORETIC) 20-12.5 MG per tablet take 1 tablet by mouth once daily 90 tablet 0    PREMARIN 0.625 MG tablet take 1 tablet by mouth once daily 30 tablet 5    busPIRone (BUSPAR) 15 MG tablet Take 15 mg by mouth      Calcium Carbonate-Vitamin D (OYSTER SHELL CALCIUM/D) 500-5 MG-MCG TABS Take 1 tablet by mouth daily      Cholecalciferol 50 MCG (2000 UT) CAPS Take 50 mcg by mouth daily      levothyroxine (SYNTHROID) 25 MCG tablet Take 1 tablet by mouth daily Take before breakfast      omeprazole (PRILOSEC) 40 MG  delayed release capsule Take 1 capsule by mouth as needed      potassium chloride (KLOR-CON) 10 MEQ extended release tablet Take 1 tablet by mouth daily with food      FEROSUL 325 (65 Fe) MG tablet take 1 tablet by mouth once daily before breakfast 30 tablet 1     No current facility-administered medications for this visit.     No Known Allergies    Objective:  BP 118/70   Pulse 78   Temp 98.6 F (37 C)   Resp 16   Ht 1.702 m (5\' 7" )   Wt 105.7 kg (233 lb)   SpO2 98%   BMI 36.49 kg/m     Physical Exam:   Physical Exam  Vitals and nursing note reviewed.   Constitutional:       Appearance: Normal appearance. She is obese.   HENT:      Head: Normocephalic and atraumatic.      Right Ear: Tympanic membrane, ear canal and external ear normal.      Left Ear: Tympanic membrane, ear canal and external ear normal.      Nose: Nose normal.      Mouth/Throat:      Mouth: Mucous membranes are moist.      Pharynx: Oropharynx is clear.   Eyes:      Extraocular Movements: Extraocular movements intact.      Pupils: Pupils are equal, round, and reactive to light.   Cardiovascular:      Rate and Rhythm: Normal rate and regular rhythm.      Pulses: Normal pulses.      Heart sounds: Normal heart sounds.   Pulmonary:      Effort: Pulmonary effort is normal.      Breath sounds: Normal breath sounds.   Abdominal:      General: Abdomen is flat. Bowel sounds are normal.      Palpations: Abdomen is soft.   Musculoskeletal:         General: Normal range of motion.      Cervical back: Normal range of motion and neck supple.   Skin:     General: Skin is warm and dry.  Capillary Refill: Capillary refill takes less than 2 seconds.   Neurological:      General: No focal deficit present.      Mental Status: She is alert and oriented to person, place, and time. Mental status is at baseline.   Psychiatric:         Mood and Affect: Mood normal.         Behavior: Behavior normal.         Thought Content: Thought content normal.          Judgment: Judgment normal.              No results found for this visit on 02/05/22.    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 Gastroesophageal reflux disease with esophagitis without hemorrhage  K21.00       3. Mixed hyperlipidemia  E78.2       4. Anxiety  F41.9       5. Iron deficiency anemia secondary to inadequate dietary iron intake  D50.8       6. Acquired hypothyroidism  E03.9         No orders of the defined types were placed in this encounter.    Cannot display discharge medications since this is not an admission.

## 2022-02-05 NOTE — Progress Notes (Signed)
Chief Complaint   Patient presents with    Follow-up     BP 118/70   Pulse 78   Temp 98.6 F (37 C)   Resp 16   Ht 1.702 m (5\' 7" )   Wt 105.7 kg (233 lb)   SpO2 98%   BMI 36.49 kg/m   1. Have you been to the ER, urgent care clinic since your last visit?  Hospitalized since your last visit?No    2. Have you seen or consulted any other health care providers outside of the [facility] since your last visit?  Include any pap smears or colon screening. No

## 2022-02-12 ENCOUNTER — Encounter: Payer: BLUE CROSS/BLUE SHIELD | Primary: Family Medicine

## 2022-02-25 MED ORDER — PREMARIN 0.625 MG PO TABS
0.625 MG | ORAL_TABLET | ORAL | 5 refills | Status: DC
Start: 2022-02-25 — End: 2022-08-25

## 2022-02-26 MED ORDER — BUSPIRONE HCL 15 MG PO TABS
15 MG | ORAL_TABLET | ORAL | 2 refills | Status: DC
Start: 2022-02-26 — End: 2022-04-28

## 2022-02-26 MED ORDER — ESCITALOPRAM OXALATE 10 MG PO TABS
10 MG | ORAL_TABLET | Freq: Every day | ORAL | 0 refills | Status: DC
Start: 2022-02-26 — End: 2022-02-26

## 2022-02-26 MED ORDER — ESCITALOPRAM OXALATE 10 MG PO TABS
10 MG | ORAL_TABLET | Freq: Every day | ORAL | 2 refills | Status: DC
Start: 2022-02-26 — End: 2022-04-28

## 2022-02-26 MED ORDER — BUSPIRONE HCL 15 MG PO TABS
15 MG | ORAL_TABLET | ORAL | 0 refills | Status: DC
Start: 2022-02-26 — End: 2022-02-26

## 2022-02-26 NOTE — Progress Notes (Signed)
rx

## 2022-02-26 NOTE — Telephone Encounter (Signed)
busPIRone (BUSPAR) 15 MG tablet     escitalopram (LEXAPRO) 10 MG tablet     Pt states she is completely out of buspirone and she has 5 lexapro left

## 2022-04-09 ENCOUNTER — Ambulatory Visit: Admit: 2022-04-09 | Discharge: 2022-04-14 | Payer: BLUE CROSS/BLUE SHIELD | Primary: Family Medicine

## 2022-04-09 DIAGNOSIS — R200 Anesthesia of skin: Secondary | ICD-10-CM

## 2022-04-09 MED ORDER — LISINOPRIL-HYDROCHLOROTHIAZIDE 20-12.5 MG PO TABS
ORAL_TABLET | Freq: Every day | ORAL | 0 refills | Status: AC
Start: 2022-04-09 — End: 2022-06-27

## 2022-04-09 NOTE — Progress Notes (Signed)
EMG/ NCS Report  [facility]  [address] [place], [place]  [address]   Ph: [phone]   FAX: [phone]    Test Date:  04/09/2022    Patient: Teresa Beasley, Teresa Beasley DOB: 1980-10-08 Physician: Elisha Headland MD   ID#: [id number] SEX: Female Ref. Phys: Debbrah Alar MD   Tech: Jamie Kato    Patient History / Exam:  Patient complaining of bilateral leg numbness and tinging since 03/2021. Assess for neuropathy vs lumbar radiculopathy.    EMG & NCV Findings:  Sensory and motor nerve conduction studies (as indicated in the tables) were within reference of normal except for decrease bilateral peroneal motor recording to the EDB compound muscle action potential amplitudes.     All F Wave latencies were within normal limits.  All F Wave left vs. right side latency differences were within normal limits.  All H Reflex left vs. right side latency differences were within normal limits.      Disposable concentric needle EMG (as indicated in the table) showed no evidence of electrical instability.      Impressions:   This study is normal.  There is no electrodiagnostic evidence of a generalized neuropathy, myopathy or significant lumbar radiculopathy at this time. Isolated bilateral peroneal motor recording to the EDB can be a variant of normal.     Thank you for the consult.    Elisha Headland MD    Nerve Conduction Studies  Anti Sensory Summary Table     Stim Site NR Peak (ms) Norm Peak (ms) P-T Amp (V) Norm P-T Amp Site1 Site2 Dist (cm)   Left Sup Fibular Anti Sensory (Lat ankle)  32.2 C   Lower leg    2.6 <4.5 13.6 >5 Lower leg Lat ankle 10.0   Site 2    2.6  14.2       Site 3    2.7  14.5       Right Sup Fibular Anti Sensory (Lat ankle)  31.1 C   Lower leg    2.7 <4.5 14.8 >5 Lower leg Lat ankle 10.0   Site 2    2.6  14.9           2.7  14.4       Left Sural Anti Sensory (Lat Mall)  31.3 C   Calf    3.4 <4.5 8.7 >4.0 Calf Lat Mall 14.0    Site 2    3.4  6.1       Site 3    3.3  5.5       Right Sural Anti Sensory (Lat Mall)  30.8 C   Calf    3.5 <4.5 9.0 >4.0 Calf Lat Mall 14.0   Site 2    3.5  15.4           3.6  7.6         Motor Summary Table     Stim Site NR Onset (ms) Norm Onset (ms) O-P Amp (mV) Norm O-P Amp Amp (Prev) (%) Site1 Site2 Dist (cm) Vel (m/s) Norm Vel (m/s)   Left Fibular Motor (Ext Dig Brev)  32.3 C   Ankle    3.9 <6.5 1.7 >2.6 100.0 Ankle Ext Dig Brev 8.0     B Fib    11.2  1.1  64.7 B Fib Ankle 29.0 40 >38   Poplt    12.9  1.1  100.0 Poplt B Fib 10.0  59 >42   Right Fibular Motor (Ext Dig Brev)  31.3 C   Ankle    4.4 <6.5 1.2 >2.6 100.0 Ankle Ext Dig Brev 8.0     B Fib    11.5  0.9  75.0 B Fib Ankle 32.0 45 >38   Poplt    12.4  1.3  144.4 Poplt B Fib 10.0 111 >42   Left Fibular TA Motor (Tib Ant)  24.6 C   Fib Head    3.1 <4.0 5.4 >4.0 100.0 Fib Head Tib Ant 10.0     Poplit    4.0 <5.7 4.0  74.1 Poplit Fib Head 10.0 111 >40   Right Fibular TA Motor (Tib Ant)  24.5 C   Fib Head    2.7 <4.0 4.9 >4.0 100.0 Fib Head Tib Ant 10.0     Poplit    4.1 <5.7 5.4  110.2 Poplit Fib Head 10.0 71 >40   Left Tibial Motor (Abd Hall Brev)  32.3 C   Ankle    4.3 <6.1 12.0 >5.3 100.0 Ankle Abd Hall Brev 8.0     Knee    11.6  9.2  76.7 Knee Ankle 34.0 47 >39   Right Tibial Motor (Abd Hall Brev)  31.5 C   Ankle    3.9 <6.1 10.7 >5.3 100.0 Ankle Abd Hall Brev 8.0     Knee    11.5  7.0  65.4 Knee Ankle 35.0 46 >39     F Wave Studies     NR F-Lat (ms) Lat Norm (ms) L-R F-Lat (ms) L-R Lat Norm   Left Tibial (Mrkrs) (Abd Hallucis)  32 C      50.88 <56 0.82 <5.7   Right Tibial (Mrkrs) (Abd Hallucis)  31.4 C      51.70 <56 0.82 <5.7     H Reflex Studies     NR H-Lat (ms) L-R H-Lat (ms) L-R Lat Norm   Left Tibial (Gastroc)  32 C      33.49 0.00 <2.0   Right Tibial (Gastroc)  31.5 C      33.49 0.00 <2.0       EMG     Side Muscle Nerve Root Ins Act Fibs Psw Recrt Duration Amp Poly Comment   Left VastusLat Femoral L2-4 Nml Nml Nml Nml Nml Nml Nml     Left MedGastroc Tibial S1-2 Nml Nml Nml Nml Nml Nml Nml    Left AntTibialis Dp Br Peron L4-5 Nml Nml Nml Nml Nml Nml Nml    Left Peroneus Long   Nml Nml Nml Nml Nml Nml Nml    Left TensorFascLat SupGluteal L4-5, S1 Nml Nml Nml Nml Nml Nml Nml    Left Lower Lumb Parasp Rami L5,S1 Nml Nml Nml Nml Nml Nml Nml    Left Mid Lumb Parasp Rami L4,5 Nml Nml Nml Nml Nml Nml Nml    Right VastusLat Femoral L2-4 Nml Nml Nml Nml Nml Nml Nml    Right MedGastroc Tibial S1-2 Nml Nml Nml Nml Nml Nml Nml    Right AntTibialis Dp Br Peron L4-5 Nml Nml Nml Nml Nml Nml Nml    Right Peroneus Long   Nml Nml Nml Nml Nml Nml Nml    Right TensorFascLat SupGluteal L4-5, S1 Nml Nml Nml Nml Nml Nml Nml    Right Lower Lumb Parasp Rami L5,S1 Nml Nml Nml Nml Nml Nml Nml    Right Mid Lumb Parasp Rami L4,5 Nml Nml Nml Nml Nml Nml Nml  Waveforms:

## 2022-04-09 NOTE — Progress Notes (Signed)
rx

## 2022-04-14 NOTE — Telephone Encounter (Signed)
let her know that her MRI brain was normal and so were the EMG of the uppers and lowers. No need to return for f/u as this does not appear to be neurological in nature. if she insists, please put her in with [facility] because I would not know what else to order for her. Thanks!     Called and no answer, left detailed msg regarding results.

## 2022-04-15 ENCOUNTER — Encounter: Payer: BLUE CROSS/BLUE SHIELD | Primary: Family Medicine

## 2022-04-24 MED ORDER — ATORVASTATIN CALCIUM 20 MG PO TABS
20 MG | ORAL_TABLET | Freq: Every day | ORAL | 0 refills | Status: DC
Start: 2022-04-24 — End: 2022-04-24

## 2022-04-24 NOTE — Telephone Encounter (Signed)
atorvastatin (LIPITOR) 20 MG tablet     Pt states she ran out of medication on 1.16.24

## 2022-04-24 NOTE — Progress Notes (Signed)
rx

## 2022-04-25 MED ORDER — ATORVASTATIN CALCIUM 20 MG PO TABS
20 MG | ORAL_TABLET | Freq: Every day | ORAL | 0 refills | Status: DC
Start: 2022-04-25 — End: 2022-10-16

## 2022-04-28 MED ORDER — BUSPIRONE HCL 15 MG PO TABS
15 MG | ORAL_TABLET | ORAL | 2 refills | Status: DC
Start: 2022-04-28 — End: 2022-06-27

## 2022-04-28 MED ORDER — ESCITALOPRAM OXALATE 10 MG PO TABS
10 MG | ORAL_TABLET | Freq: Every day | ORAL | 2 refills | Status: DC
Start: 2022-04-28 — End: 2022-06-27

## 2022-05-04 LAB — BASIC METABOLIC PANEL
BUN/Creatinine Ratio: 22 (ref 9–23)
BUN: 15 mg/dL (ref 6–24)
CO2: 23 mmol/L (ref 20–29)
Calcium: 9.1 mg/dL (ref 8.7–10.2)
Chloride: 105 mmol/L (ref 96–106)
Creatinine: 0.67 mg/dL (ref 0.57–1.00)
Est, Glomerular Filtration Rate: 113 mL/min/{1.73_m2} (ref >59)
Glucose: 103 mg/dL — ABNORMAL HIGH (ref 70–99)
Potassium: 4.4 mmol/L (ref 3.5–5.2)
Sodium: 143 mmol/L (ref 134–144)

## 2022-05-04 LAB — THYROID CASCADE PROFILE: TSH: 4.06 u[IU]/mL (ref 0.450–4.500)

## 2022-05-04 LAB — LIPID PANEL
Cholesterol: 147 mg/dL (ref 100–199)
HDL: 50 mg/dL (ref >39)
LDL Calculated: 78 mg/dL (ref 0–99)
Triglycerides: 104 mg/dL (ref 0–149)
VLDL Cholesterol Calculated: 19 mg/dL (ref 5–40)

## 2022-05-05 NOTE — Other (Signed)
Will discuss result(s) at next visit.

## 2022-05-07 ENCOUNTER — Encounter

## 2022-05-07 ENCOUNTER — Encounter
Admit: 2022-05-07 | Discharge: 2022-05-07 | Payer: BLUE CROSS/BLUE SHIELD | Attending: Family Medicine | Primary: Family Medicine

## 2022-05-07 DIAGNOSIS — I119 Hypertensive heart disease without heart failure: Secondary | ICD-10-CM

## 2022-05-07 NOTE — Progress Notes (Signed)
Teresa Beasley is a 42 y.o. female and presents with Follow-up and Hypertension  .  Hypertension     with a hx of HTN and Hypothyroidism  42 y.o. Patient here on a routine follow up with no recent symptoms except numbness and a feeling of vibrations in her hands and legs in pt with negative Neurological work up  Pt with normal TDH who has been off synthroid-will recheck    Subjective:  Cardiovascular Review:  The patient has hypertension   Diet and Lifestyle: generally follows a low fat low cholesterol diet, generally follows a low sodium diet, exercises sporadically  Home BP Monitoring: is not measured at home.  Pertinent ROS: taking medications as instructed, no medication side effects noted, no TIA's, no chest pain on exertion, no dyspnea on exertion, no swelling of ankles.     Review of Systems  Review of Systems - Neurological ROS: positive for - numbness/tingling       Past Medical History:   Diagnosis Date    Acquired hypothyroidism 11/02/2018    Anxiety     Bilateral ovarian cysts     Removal 03/24/2010    Ear problems     Essential hypertension 11/02/2018    Gastro-esophageal reflux disease with esophagitis 11/02/2018    Generalized anxiety disorder 11/02/2018    GERD (gastroesophageal reflux disease)     High cholesterol     Hypercholesterolemia     Hypernatremia 11/02/2018    Obesity 11/02/2018    Obesity (BMI 30-39.9) 11/02/2018    Otalgia of right ear 11/02/2018    URI (upper respiratory infection) 11/02/2018     Past Surgical History:   Procedure Laterality Date    CHOLECYSTECTOMY  11/27/2020    GYN      HYSTERECTOMY (CERVIX STATUS UNKNOWN)      IR CHOLECYSTOSTOMY PERCUTANEOUS COMPLETE      OVARIAN CYST REMOVAL  2007    OVARIAN CYST & FALLOPIAN TUBE REMOVED BY Dr. Hassell Done    UPPER GASTROINTESTINAL ENDOSCOPY      WISDOM TOOTH EXTRACTION       Social History     Socioeconomic History    Marital status: Married     Spouse name: None    Number of children: None    Years of education: None    Highest  education level: None   Tobacco Use    Smoking status: Never    Smokeless tobacco: Never   Substance and Sexual Activity    Alcohol use: Not Currently    Drug use: Never    Sexual activity: Yes     Birth control/protection: Implant     Comment: nuvaring     Social Determinants of Health     Financial Resource Strain: Low Risk  (08/07/2021)    Overall Financial Resource Strain (CARDIA)     Difficulty of Paying Living Expenses: Not hard at all   Transportation Needs: Unknown (08/07/2021)    PRAPARE - Transportation     Lack of Transportation (Non-Medical): No   Housing Stability: Unknown (08/07/2021)    Housing Stability Vital Sign     Unstable Housing in the Last Year: No     Family History   Problem Relation Age of Onset    Anesth Problems Neg Hx     Thyroid Disease Sister     Emphysema Father     COPD Father     Mult Sclerosis Father     Hypertension Mother      Current Outpatient Medications  Medication Sig Dispense Refill    busPIRone (BUSPAR) 15 MG tablet take 1 tablet by mouth once daily if needed for anxiety 30 tablet 2    escitalopram (LEXAPRO) 10 MG tablet Take 1 tablet by mouth daily 30 tablet 2    atorvastatin (LIPITOR) 20 MG tablet Take 1 tablet by mouth daily 90 tablet 0    lisinopril-hydroCHLOROthiazide (PRINZIDE;ZESTORETIC) 20-12.5 MG per tablet Take 1 tablet by mouth daily 90 tablet 0    PREMARIN 0.625 MG tablet take 1 tablet by mouth once daily 30 tablet 5    FEROSUL 325 (65 Fe) MG tablet take 1 tablet by mouth once daily 60 tablet 1    Calcium Carbonate-Vitamin D (OYSTER SHELL CALCIUM/D) 500-5 MG-MCG TABS Take 1 tablet by mouth daily      Cholecalciferol 50 MCG (2000 UT) CAPS Take 50 mcg by mouth daily      omeprazole (PRILOSEC) 40 MG delayed release capsule Take 1 capsule by mouth as needed      potassium chloride (KLOR-CON) 10 MEQ extended release tablet Take 1 tablet by mouth daily with food      busPIRone (BUSPAR) 15 MG tablet Take 15 mg by mouth (Patient not taking: Reported on 05/07/2022)       levothyroxine (SYNTHROID) 25 MCG tablet Take 1 tablet by mouth daily Take before breakfast (Patient not taking: Reported on 05/07/2022)       No current facility-administered medications for this visit.     No Known Allergies    Objective:  BP 134/68 (Site: Left Upper Arm, Position: Sitting, Cuff Size: Large Adult)   Pulse 73   Temp 98.4 F (36.9 C) (Temporal)   Resp 18   Ht 1.702 m (5' 7"$ )   Wt 105.7 kg (233 lb)   SpO2 98%   BMI 36.49 kg/m     Physical Exam:   Physical Exam  Vitals and nursing note reviewed.   Constitutional:       Appearance: Normal appearance. She is obese.   HENT:      Head: Normocephalic and atraumatic.      Right Ear: Tympanic membrane, ear canal and external ear normal.      Left Ear: Tympanic membrane, ear canal and external ear normal.      Nose: Nose normal.      Mouth/Throat:      Mouth: Mucous membranes are moist.      Pharynx: Oropharynx is clear.   Eyes:      Extraocular Movements: Extraocular movements intact.      Pupils: Pupils are equal, round, and reactive to light.   Cardiovascular:      Rate and Rhythm: Normal rate and regular rhythm.      Pulses: Normal pulses.      Heart sounds: Normal heart sounds.   Pulmonary:      Effort: Pulmonary effort is normal.      Breath sounds: Normal breath sounds.   Abdominal:      General: Abdomen is flat. Bowel sounds are normal.      Palpations: Abdomen is soft.   Musculoskeletal:         General: Normal range of motion.      Cervical back: Normal range of motion and neck supple.   Skin:     General: Skin is warm and dry.      Capillary Refill: Capillary refill takes less than 2 seconds.   Neurological:      General: No focal deficit present.  Mental Status: She is alert and oriented to person, place, and time. Mental status is at baseline.   Psychiatric:         Mood and Affect: Mood normal.         Behavior: Behavior normal.         Thought Content: Thought content normal.         Judgment: Judgment normal.              No results  found for this visit on 05/07/22.    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 Gastroesophageal reflux disease with esophagitis without hemorrhage  K21.00       3. Acquired hypothyroidism  E03.9 Thyroid Cascade Profile     T4      4. Iron deficiency anemia secondary to inadequate dietary iron intake  D50.8 CBC      5. S/P total hysterectomy  Z90.710       6. Surgical menopause, symptomatic  E89.41       7. Bilateral carpal tunnel syndrome  G56.03       8. Obesity (BMI 30-39.9)  E66.9       [id number] controlled and at goal    No orders of the defined types were placed in this encounter.    Cannot display discharge medications since this is not an admission.

## 2022-05-07 NOTE — Progress Notes (Signed)
Room:  I have reviewed all needed documentation in preparation for visit. Verified patient by name and date of birth. Rooming procedures conducted per protocol. Reviewed allergies and medications with patient.   Teresa Beasley is a 42 y.o. female for Follow-up and Hypertension       Vitals:    05/07/22 0926   Resp: 18   Height: 1.702 m (5' 7"$ )      No data recorded  No LMP recorded. Patient has had a hysterectomy.      Health Maintenance Due   Topic Date Due    Hepatitis B vaccine (1 of 3 - 3-dose series) Never done    Varicella vaccine (1 of 2 - 2-dose childhood series) Never done    HIV screen  Never done    Hepatitis C screen  Never done    DTaP/Tdap/Td vaccine (1 - Tdap) Never done    Diabetes screen  Never done    Flu vaccine (1) 10/22/2021    COVID-19 Vaccine (4 - 2023-24 season) 11/22/2021        "Have you been to the ER, urgent care clinic since your last visit?  Hospitalized since your last visit?"    NO    "Have you seen or consulted any other health care providers outside of [place] since your last visit?"    NO       Recent Travel Screening and Travel History documentation:     Travel Screening       Question Response    Have you been in contact with someone who was sick? No / Unsure    Do you have any of the following new or worsening symptoms? None of these    Have you traveled internationally or domestically in the last month? No          Travel History   Travel since 04/06/22    No documented travel since 04/06/22

## 2022-05-19 MED ORDER — FEROSUL 325 (65 FE) MG PO TABS
32565 (65 Fe) MG | ORAL_TABLET | Freq: Every day | ORAL | 0 refills | Status: AC
Start: 2022-05-19 — End: 2022-06-23

## 2022-06-23 MED ORDER — FEROSUL 325 (65 FE) MG PO TABS
32565 (65 Fe) MG | ORAL_TABLET | Freq: Every day | ORAL | 0 refills | Status: AC
Start: 2022-06-23 — End: 2022-07-23

## 2022-06-30 MED ORDER — ESCITALOPRAM OXALATE 10 MG PO TABS
10 MG | ORAL_TABLET | Freq: Every day | ORAL | 0 refills | Status: AC
Start: 2022-06-30 — End: 2022-09-28

## 2022-06-30 MED ORDER — BUSPIRONE HCL 15 MG PO TABS
15 MG | ORAL_TABLET | ORAL | 2 refills | Status: AC
Start: 2022-06-30 — End: 2022-08-20

## 2022-06-30 MED ORDER — LISINOPRIL-HYDROCHLOROTHIAZIDE 20-12.5 MG PO TABS
20-12.5 MG | ORAL_TABLET | Freq: Every day | ORAL | 0 refills | Status: AC
Start: 2022-06-30 — End: 2022-09-24

## 2022-07-08 MED ORDER — ESCITALOPRAM OXALATE 10 MG PO TABS
10 | ORAL_TABLET | Freq: Every day | ORAL | 0 refills | Status: AC
Start: 2022-07-08 — End: 2022-10-06

## 2022-07-08 NOTE — Progress Notes (Signed)
rx

## 2022-07-23 MED ORDER — FEROSUL 325 (65 FE) MG PO TABS
32565 (65 Fe) MG | ORAL_TABLET | Freq: Every day | ORAL | 0 refills | Status: DC
Start: 2022-07-23 — End: 2022-08-25

## 2022-08-10 LAB — CBC
Hematocrit: 36.8 % (ref 34.0–46.6)
Hemoglobin: 11.9 g/dL (ref 11.1–15.9)
MCH: 27.7 pg (ref 26.6–33.0)
MCHC: 32.3 g/dL (ref 31.5–35.7)
MCV: 86 fL (ref 79–97)
Platelets: 209 10*3/uL (ref 150–450)
RBC: 4.29 x10E6/uL (ref 3.77–5.28)
RDW: 14.1 % (ref 11.7–15.4)
WBC: 6.1 10*3/uL (ref 3.4–10.8)

## 2022-08-10 LAB — THYROID CASCADE PROFILE: TSH: 2.98 u[IU]/mL (ref 0.450–4.500)

## 2022-08-10 LAB — T4: T4, Total: 8.8 ug/dL (ref 4.5–12.0)

## 2022-08-10 NOTE — Other (Signed)
Will discuss results at next visit 

## 2022-08-20 ENCOUNTER — Ambulatory Visit
Admit: 2022-08-20 | Discharge: 2022-08-20 | Payer: BLUE CROSS/BLUE SHIELD | Attending: Family Medicine | Primary: Family Medicine

## 2022-08-20 DIAGNOSIS — I119 Hypertensive heart disease without heart failure: Secondary | ICD-10-CM

## 2022-08-20 NOTE — Progress Notes (Signed)
Chief Complaint   Patient presents with    Hypertension     Follow up for hypertension     BP (!) 111/59 (Site: Right Upper Arm, Position: Sitting, Cuff Size: Medium Adult)   Pulse 82   Temp 98.2 F (36.8 C) (Oral)   Resp 16   Ht 1.702 m (5\' 7" )   Wt 104.6 kg (230 lb 9.6 oz)   SpO2 97%   BMI 36.12 kg/m      "Have you been to the ER, urgent care clinic since your last visit?  Hospitalized since your last visit?"    NO    "Have you seen or consulted any other health care providers outside of [facility] since your last visit?"    NO    Have you had a mammogram?"   NO, will schedule one    No breast cancer screening on file             Click Here for Release of Records Request

## 2022-08-20 NOTE — Progress Notes (Addendum)
Teresa Beasley is a 42 y.o. female and presents with Hypertension (Follow up for hypertension)  .  Hypertension     with a hx of HTN and Anemia and GERD  42 y.o. Patient here on a routine follow up with no recent symptoms n pt with improved sleep and     Subjective:  Cardiovascular Review:  The patient has hypertension   Diet and Lifestyle: generally follows a low fat low cholesterol diet, generally follows a low sodium diet, exercises sporadically  Home BP Monitoring: is not measured at home.  Pertinent ROS: taking medications as instructed, no medication side effects noted, no TIA's, no chest pain on exertion, no dyspnea on exertion, no swelling of ankles.     Review of Systems  Review of Systems - Negative        Past Medical History:   Diagnosis Date    Acquired hypothyroidism 11/02/2018    Anxiety     Bilateral ovarian cysts     Removal 03/24/2010    Ear problems     Essential hypertension 11/02/2018    Gastro-esophageal reflux disease with esophagitis 11/02/2018    Generalized anxiety disorder 11/02/2018    GERD (gastroesophageal reflux disease)     High cholesterol     Hypercholesterolemia     Hypernatremia 11/02/2018    Obesity 11/02/2018    Obesity (BMI 30-39.9) 11/02/2018    Otalgia of right ear 11/02/2018    URI (upper respiratory infection) 11/02/2018     Past Surgical History:   Procedure Laterality Date    CHOLECYSTECTOMY  11/27/2020    GYN      HYSTERECTOMY (CERVIX STATUS UNKNOWN)      IR CHOLECYSTOSTOMY PERCUTANEOUS COMPLETE      OVARIAN CYST REMOVAL  2007    OVARIAN CYST & FALLOPIAN TUBE REMOVED BY Dr. Daphine Deutscher    UPPER GASTROINTESTINAL ENDOSCOPY      WISDOM TOOTH EXTRACTION       Social History     Socioeconomic History    Marital status: Married     Spouse name: None    Number of children: None    Years of education: None    Highest education level: None   Tobacco Use    Smoking status: Never    Smokeless tobacco: Never   Substance and Sexual Activity    Alcohol use: Not Currently    Drug use:  Never    Sexual activity: Yes     Partners: Male     Birth control/protection: Implant     Comment: nuvaring     Social Determinants of Health     Financial Resource Strain: Low Risk  (08/20/2022)    Overall Financial Resource Strain (CARDIA)     Difficulty of Paying Living Expenses: Not hard at all   Food Insecurity: No Food Insecurity (08/20/2022)    Hunger Vital Sign     Worried About Radiation protection practitioner of Food in the Last Year: Never true     Ran Out of Food in the Last Year: Never true   Transportation Needs: Unknown (08/20/2022)    PRAPARE - Transportation     Lack of Transportation (Non-Medical): No   Housing Stability: Unknown (08/20/2022)    Housing Stability Vital Sign     Unstable Housing in the Last Year: No     Family History   Problem Relation Age of Onset    Anesth Problems Neg Hx     Thyroid Disease Sister     Emphysema Father  COPD Father     Mult Sclerosis Father     Hypertension Mother      Current Outpatient Medications   Medication Sig Dispense Refill    vitamin B-12 (CYANOCOBALAMIN) 500 MCG tablet Take 1 tablet by mouth daily      FEROSUL 325 (65 Fe) MG tablet take 1 tablet by mouth once daily 30 tablet 0    escitalopram (LEXAPRO) 10 MG tablet Take 1 tablet by mouth daily 90 tablet 0    lisinopril-hydroCHLOROthiazide (PRINZIDE;ZESTORETIC) 20-12.5 MG per tablet Take 1 tablet by mouth daily 90 tablet 0    atorvastatin (LIPITOR) 20 MG tablet Take 1 tablet by mouth daily 90 tablet 0    PREMARIN 0.625 MG tablet take 1 tablet by mouth once daily 30 tablet 5    busPIRone (BUSPAR) 15 MG tablet Take 15 mg by mouth      Calcium Carbonate-Vitamin D (OYSTER SHELL CALCIUM/D) 500-5 MG-MCG TABS Take 1 tablet by mouth daily      Cholecalciferol 50 MCG (2000 UT) CAPS Take 50 mcg by mouth daily      omeprazole (PRILOSEC) 40 MG delayed release capsule Take 1 capsule by mouth as needed      potassium chloride (KLOR-CON) 10 MEQ extended release tablet Take 1 tablet by mouth daily with food      busPIRone (BUSPAR) 15 MG  tablet take 1 tablet by mouth once daily if needed for anxiety (Patient not taking: Reported on 08/20/2022) 30 tablet 2    levothyroxine (SYNTHROID) 25 MCG tablet Take 1 tablet by mouth daily Take before breakfast (Patient not taking: Reported on 05/07/2022)       No current facility-administered medications for this visit.     No Known Allergies    Objective:  BP (!) 111/59 (Site: Right Upper Arm, Position: Sitting, Cuff Size: Medium Adult)   Pulse 82   Temp 98.2 F (36.8 C) (Oral)   Resp 16   Ht 1.702 m (5\' 7" )   Wt 104.6 kg (230 lb 9.6 oz)   SpO2 97%   BMI 36.12 kg/m     Physical Exam:   Physical Exam  Vitals and nursing note reviewed.   Constitutional:       Appearance: Normal appearance. She is obese.   HENT:      Head: Normocephalic and atraumatic.      Right Ear: Tympanic membrane, ear canal and external ear normal.      Left Ear: Tympanic membrane, ear canal and external ear normal.      Nose: Nose normal.      Mouth/Throat:      Mouth: Mucous membranes are moist.      Pharynx: Oropharynx is clear.   Eyes:      Extraocular Movements: Extraocular movements intact.      Pupils: Pupils are equal, round, and reactive to light.   Cardiovascular:      Rate and Rhythm: Normal rate and regular rhythm.      Pulses: Normal pulses.      Heart sounds: Normal heart sounds.   Pulmonary:      Effort: Pulmonary effort is normal.      Breath sounds: Normal breath sounds.   Abdominal:      General: Abdomen is flat. Bowel sounds are normal.      Palpations: Abdomen is soft.   Musculoskeletal:         General: Normal range of motion.      Cervical back: Normal range of motion and  neck supple.   Skin:     General: Skin is warm and dry.      Capillary Refill: Capillary refill takes less than 2 seconds.   Neurological:      General: No focal deficit present.      Mental Status: She is alert and oriented to person, place, and time. Mental status is at baseline.   Psychiatric:         Mood and Affect: Mood normal.          Behavior: Behavior normal.         Thought Content: Thought content normal.         Judgment: Judgment normal.            Labs reviewed from 08/09/22-Normal LFTs and CBC  No results found for this visit on 08/20/22.    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 Gastroesophageal reflux disease with esophagitis without hemorrhage  K21.00     stable on prilosec      3. Acquired hypothyroidism  E03.9     Levothyroxin discontinued by pt over 2 months ago-normal LFTs      4. Mixed hyperlipidemia  E78.2     continue lipitor      5. Obesity (BMI 30-39.9)  E66.9       6. S/P cholecystectomy  Z90.49       7. Iron deficiency anemia secondary to inadequate dietary iron intake  D50.8     continue feosol      8. Generalized anxiety disorder  F41.1       9. Hypokalemia  E87.6       10. Adjustment insomnia  F51.02     well controlled on buspar      ZOX:WRUE controlled and at goal    No orders of the defined types were placed in this encounter.    Cannot display discharge medications since this is not an admission.

## 2022-08-25 MED ORDER — PREMARIN 0.625 MG PO TABS
0.625 MG | ORAL_TABLET | ORAL | 5 refills | Status: AC
Start: 2022-08-25 — End: 2023-02-25

## 2022-08-26 MED ORDER — FEROSUL 325 (65 FE) MG PO TABS
325 | ORAL_TABLET | Freq: Every day | ORAL | 2 refills | Status: AC
Start: 2022-08-26 — End: ?

## 2022-09-24 MED ORDER — LISINOPRIL-HYDROCHLOROTHIAZIDE 20-12.5 MG PO TABS
20-12.5 MG | ORAL_TABLET | Freq: Every day | ORAL | 0 refills | Status: AC
Start: 2022-09-24 — End: 2022-12-14

## 2022-09-29 MED ORDER — BUSPIRONE HCL 15 MG PO TABS
15 | ORAL_TABLET | ORAL | 1 refills | Status: AC
Start: 2022-09-29 — End: ?

## 2022-10-16 MED ORDER — ATORVASTATIN CALCIUM 20 MG PO TABS
20 MG | ORAL_TABLET | Freq: Every day | ORAL | 0 refills | Status: AC
Start: 2022-10-16 — End: 2022-12-14

## 2022-11-19 ENCOUNTER — Ambulatory Visit
Admit: 2022-11-19 | Discharge: 2022-11-19 | Payer: BLUE CROSS/BLUE SHIELD | Attending: Family Medicine | Primary: Family Medicine

## 2022-11-19 VITALS — BP 114/73 | HR 85 | Temp 98.40000°F | Resp 16 | Ht 67.0 in | Wt 235.6 lb

## 2022-11-19 DIAGNOSIS — I119 Hypertensive heart disease without heart failure: Secondary | ICD-10-CM

## 2022-11-19 MED ORDER — ESCITALOPRAM OXALATE 10 MG PO TABS
10 | ORAL_TABLET | Freq: Every day | ORAL | 0 refills | Status: DC
Start: 2022-11-19 — End: 2022-12-14

## 2022-11-19 MED ORDER — FERROUS SULFATE 325 (65 FE) MG PO TABS
325 | ORAL_TABLET | Freq: Every day | ORAL | 2 refills | Status: DC
Start: 2022-11-19 — End: 2023-02-24

## 2022-11-19 MED ORDER — BUSPIRONE HCL 15 MG PO TABS
15 | ORAL_TABLET | ORAL | 0 refills | Status: DC
Start: 2022-11-19 — End: 2022-12-14

## 2022-11-19 NOTE — Progress Notes (Signed)
 Chief Complaint   Patient presents with    3 Month Follow-Up    Hypertension     Pt here for her 3 month follow up visit for hypertension     BP 114/73 (Site: Right Upper Arm, Position: Sitting, Cuff Size: Large Adult)   Pulse 85   Temp 98.4 F (36.9 C) (Oral)   Resp 16   Ht 1.702 m (5' 7)   Wt 106.9 kg (235 lb 9.6 oz)   SpO2 97%   BMI 36.90 kg/m         Have you been to the ER, urgent care clinic since your last visit?  Hospitalized since your last visit?    NO    "Have you seen or consulted any other health care providers outside of [facility] since your last visit?"    NO    Have you had a mammogram?"   NO    No breast cancer screening on file             Click Here for Release of Records Request

## 2022-11-19 NOTE — Progress Notes (Signed)
 Teresa Beasley is a 42 y.o. female and presents with 3 Month Follow-Up and Hypertension (Pt here for her 3 month follow up visit for hypertension)  .  Hypertension     with a hx of HTN and GERD and Anemia  42 y.o. Patient here on a routine follow up with no recent symptoms    Subjective:  Cardiovascular Review:  The patient has hypertension   Diet and Lifestyle: generally follows a low fat low cholesterol diet, generally follows a low sodium diet, exercises sporadically  Home BP Monitoring: is not measured at home.  Pertinent ROS: taking medications as instructed, no medication side effects noted, no TIA's, no chest pain on exertion, no dyspnea on exertion, no swelling of ankles.     Review of Systems  Review of Systems - Psychological ROS: positive for - anxiety and depression       Past Medical History:   Diagnosis Date    Acquired hypothyroidism 11/02/2018    Anxiety     Bilateral ovarian cysts     Removal 03/24/2010    Ear problems     Essential hypertension 11/02/2018    Gastro-esophageal reflux disease with esophagitis 11/02/2018    Generalized anxiety disorder 11/02/2018    GERD (gastroesophageal reflux disease)     High cholesterol     Hypercholesterolemia     Hypernatremia 11/02/2018    Obesity 11/02/2018    Obesity (BMI 30-39.9) 11/02/2018    Otalgia of right ear 11/02/2018    URI (upper respiratory infection) 11/02/2018     Past Surgical History:   Procedure Laterality Date    CHOLECYSTECTOMY  11/27/2020    GYN      HYSTERECTOMY (CERVIX STATUS UNKNOWN)      IR CHOLECYSTOSTOMY PERCUTANEOUS COMPLETE      OVARIAN CYST REMOVAL  2007    OVARIAN CYST & FALLOPIAN TUBE REMOVED BY Dr. Gladis    UPPER GASTROINTESTINAL ENDOSCOPY      WISDOM TOOTH EXTRACTION       Social History     Socioeconomic History    Marital status: Married     Spouse name: None    Number of children: None    Years of education: None    Highest education level: None   Tobacco Use    Smoking status: Never    Smokeless tobacco: Never    Substance and Sexual Activity    Alcohol use: Not Currently    Drug use: Never    Sexual activity: Yes     Partners: Male     Birth control/protection: Implant     Comment: nuvaring     Social Determinants of Health     Financial Resource Strain: Low Risk  (08/20/2022)    Overall Financial Resource Strain (CARDIA)     Difficulty of Paying Living Expenses: Not hard at all   Food Insecurity: No Food Insecurity (08/20/2022)    Hunger Vital Sign     Worried About Running Out of Food in the Last Year: Never true     Ran Out of Food in the Last Year: Never true   Transportation Needs: Unknown (08/20/2022)    PRAPARE - Transportation     Lack of Transportation (Non-Medical): No   Housing Stability: Unknown (08/20/2022)    Housing Stability Vital Sign     Unstable Housing in the Last Year: No     Family History   Problem Relation Age of Onset    Anesth Problems Neg Hx  Thyroid Disease Sister     Emphysema Father     COPD Father     Mult Sclerosis Father     Hypertension Mother      Current Outpatient Medications   Medication Sig Dispense Refill    atorvastatin  (LIPITOR) 20 MG tablet take 1 tablet by mouth once daily 90 tablet 0    busPIRone  (BUSPAR ) 15 MG tablet take 1 tablet by mouth once daily if needed for anxiety 30 tablet 1    lisinopril -hydroCHLOROthiazide  (PRINZIDE ;ZESTORETIC ) 20-12.5 MG per tablet take 1 tablet by mouth once daily 90 tablet 0    FEROSUL 325 (65 Fe) MG tablet take 1 tablet by mouth once daily 30 tablet 2    PREMARIN  0.625 MG tablet take 1 tablet by mouth once daily 30 tablet 5    vitamin B-12 (CYANOCOBALAMIN) 500 MCG tablet Take 1 tablet by mouth daily      escitalopram  (LEXAPRO ) 10 MG tablet Take 1 tablet by mouth daily 90 tablet 0    Calcium  Carbonate-Vitamin D (OYSTER SHELL CALCIUM /D) 500-5 MG-MCG TABS Take 1 tablet by mouth daily      Cholecalciferol 50 MCG (2000 UT) CAPS Take 50 mcg by mouth daily      omeprazole (PRILOSEC) 40 MG delayed release capsule Take 1 capsule by mouth as needed       potassium chloride (KLOR-CON) 10 MEQ extended release tablet Take 1 tablet by mouth daily with food       No current facility-administered medications for this visit.     No Known Allergies    Objective:  BP 114/73 (Site: Right Upper Arm, Position: Sitting, Cuff Size: Large Adult)   Pulse 85   Temp 98.4 F (36.9 C) (Oral)   Resp 16   Ht 1.702 m (5' 7)   Wt 106.9 kg (235 lb 9.6 oz)   SpO2 97%   BMI 36.90 kg/m     Physical Exam:   Physical Exam  Vitals and nursing note reviewed.   Constitutional:       Appearance: Normal appearance. She is obese.   HENT:      Head: Normocephalic and atraumatic.      Right Ear: Tympanic membrane, ear canal and external ear normal.      Left Ear: Tympanic membrane, ear canal and external ear normal.      Nose: Nose normal.      Mouth/Throat:      Mouth: Mucous membranes are moist.      Pharynx: Oropharynx is clear.   Eyes:      Extraocular Movements: Extraocular movements intact.      Pupils: Pupils are equal, round, and reactive to light.   Cardiovascular:      Rate and Rhythm: Normal rate and regular rhythm.      Pulses: Normal pulses.      Heart sounds: Normal heart sounds.   Pulmonary:      Effort: Pulmonary effort is normal.      Breath sounds: Normal breath sounds.   Abdominal:      General: Abdomen is flat. Bowel sounds are normal.      Palpations: Abdomen is soft.   Musculoskeletal:         General: Normal range of motion.      Cervical back: Normal range of motion and neck supple.   Skin:     General: Skin is warm and dry.      Capillary Refill: Capillary refill takes less than 2 seconds.   Neurological:  General: No focal deficit present.      Mental Status: She is alert and oriented to person, place, and time. Mental status is at baseline.   Psychiatric:         Mood and Affect: Mood normal.         Behavior: Behavior normal.         Thought Content: Thought content normal.         Judgment: Judgment normal.              No results found for this visit on  11/19/22.    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 Severe obesity (BMI 35.0-39.9) with comorbidity (HCC)  E66.01       3. Gastroesophageal reflux disease with esophagitis without hemorrhage  K21.00       4. Acquired hypothyroidism  E03.9       5. Mixed hyperlipidemia  E78.2       6. S/P cholecystectomy  Z90.49       7. Iron deficiency anemia secondary to inadequate dietary iron intake  D50.8       YUW:Tzoo controlled and at goal    No orders of the defined types were placed in this encounter.    Cannot display discharge medications since this is not an admission.

## 2022-12-15 MED ORDER — ESCITALOPRAM OXALATE 10 MG PO TABS
10 MG | ORAL_TABLET | Freq: Every day | ORAL | 0 refills | Status: DC
Start: 2022-12-15 — End: 2023-05-06

## 2022-12-15 MED ORDER — BUSPIRONE HCL 15 MG PO TABS
15 MG | ORAL_TABLET | ORAL | 0 refills | Status: DC
Start: 2022-12-15 — End: 2023-05-06

## 2022-12-15 MED ORDER — ATORVASTATIN CALCIUM 20 MG PO TABS
20 MG | ORAL_TABLET | Freq: Every day | ORAL | 0 refills | Status: DC
Start: 2022-12-15 — End: 2023-04-22

## 2022-12-15 MED ORDER — LISINOPRIL-HYDROCHLOROTHIAZIDE 20-12.5 MG PO TABS
20-12.5 MG | ORAL_TABLET | Freq: Every day | ORAL | 0 refills | Status: DC
Start: 2022-12-15 — End: 2023-03-10

## 2023-02-24 MED ORDER — FEROSUL 325 (65 FE) MG PO TABS
325 | ORAL_TABLET | Freq: Every day | ORAL | 2 refills | Status: AC
Start: 2023-02-24 — End: ?

## 2023-02-25 MED ORDER — PREMARIN 0.625 MG PO TABS
0.625 | ORAL_TABLET | ORAL | 5 refills | 30.00000 days | Status: DC
Start: 2023-02-25 — End: 2023-09-07

## 2023-03-04 ENCOUNTER — Encounter: Payer: BLUE CROSS/BLUE SHIELD | Attending: Family Medicine | Primary: Family Medicine

## 2023-03-10 MED ORDER — LISINOPRIL-HYDROCHLOROTHIAZIDE 20-12.5 MG PO TABS
20-12.5 | ORAL_TABLET | Freq: Every day | ORAL | 0 refills | Status: AC
Start: 2023-03-10 — End: ?

## 2023-04-22 MED ORDER — ATORVASTATIN CALCIUM 20 MG PO TABS
20 | ORAL_TABLET | Freq: Every day | ORAL | 0 refills | 90.00000 days | Status: DC
Start: 2023-04-22 — End: 2023-07-28

## 2023-05-06 ENCOUNTER — Ambulatory Visit
Admit: 2023-05-06 | Discharge: 2023-05-06 | Payer: BLUE CROSS/BLUE SHIELD | Attending: Family Medicine | Primary: Family Medicine

## 2023-05-06 VITALS — BP 125/69 | HR 80 | Temp 98.30000°F | Resp 16 | Ht 67.0 in | Wt 243.2 lb

## 2023-05-06 DIAGNOSIS — I119 Hypertensive heart disease without heart failure: Secondary | ICD-10-CM

## 2023-05-06 MED ORDER — ESCITALOPRAM OXALATE 10 MG PO TABS
10 MG | ORAL_TABLET | Freq: Every day | ORAL | 0 refills | Status: DC
Start: 2023-05-06 — End: 2023-07-29

## 2023-05-06 MED ORDER — BUSPIRONE HCL 15 MG PO TABS
15 MG | ORAL_TABLET | ORAL | 0 refills | Status: DC
Start: 2023-05-06 — End: 2023-07-29

## 2023-05-06 MED ORDER — ZEPBOUND 2.5 MG/0.5ML SC SOAJ
2.50.5 | SUBCUTANEOUS | 0 refills | Status: AC
Start: 2023-05-06 — End: ?

## 2023-05-06 NOTE — Telephone Encounter (Signed)
Pt called to state the Zepbound script MD sent in needs a PA and to keep a lookout for it

## 2023-05-06 NOTE — Progress Notes (Signed)
Teresa Beasley is a 43 y.o. female and presents with 6 Month Follow-Up and Hypertension (Pt here for a 6 month follow up visit)  .  Hypertension     with a hx of HTN and Diabetes    Subjective:  Cardiovascular Review:  The patient has hypertension   Diet and Lifestyle: generally follows a low fat low cholesterol diet, generally follows a low sodium diet, exercises sporadically  Home BP Monitoring: is not measured at home.  Pertinent ROS: taking medications as instructed, no medication side effects noted, no TIA's, no chest pain on exertion, no dyspnea on exertion, no swelling of ankles.     Review of Systems  Review of Systems - Endocrine ROS: positive for - unexpected weight changes       Past Medical History:   Diagnosis Date    Acquired hypothyroidism 11/02/2018    Anxiety     Bilateral ovarian cysts     Removal 03/24/2010    Ear problems     Essential hypertension 11/02/2018    Gastro-esophageal reflux disease with esophagitis 11/02/2018    Generalized anxiety disorder 11/02/2018    GERD (gastroesophageal reflux disease)     High cholesterol     Hypercholesterolemia     Hypernatremia 11/02/2018    Obesity 11/02/2018    Obesity (BMI 30-39.9) 11/02/2018    Otalgia of right ear 11/02/2018    URI (upper respiratory infection) 11/02/2018     Past Surgical History:   Procedure Laterality Date    CHOLECYSTECTOMY  11/27/2020    GYN      HYSTERECTOMY (CERVIX STATUS UNKNOWN)      IR CHOLECYSTOSTOMY PERCUTANEOUS COMPLETE      OVARIAN CYST REMOVAL  2007    OVARIAN CYST & FALLOPIAN TUBE REMOVED BY Dr. Daphine Deutscher    UPPER GASTROINTESTINAL ENDOSCOPY      WISDOM TOOTH EXTRACTION       Social History     Socioeconomic History    Marital status: Married     Spouse name: None    Number of children: None    Years of education: None    Highest education level: None   Tobacco Use    Smoking status: Never    Smokeless tobacco: Never   Substance and Sexual Activity    Alcohol use: Not Currently    Drug use: Never    Sexual activity: Yes      Partners: Male     Birth control/protection: Implant     Comment: nuvaring     Social Determinants of Health     Financial Resource Strain: Low Risk  (08/20/2022)    Overall Financial Resource Strain (CARDIA)     Difficulty of Paying Living Expenses: Not hard at all   Food Insecurity: No Food Insecurity (05/04/2023)    Hunger Vital Sign     Worried About Programme researcher, broadcasting/film/video in the Last Year: Never true     Ran Out of Food in the Last Year: Never true   Transportation Needs: No Transportation Needs (05/04/2023)    PRAPARE - Therapist, art (Medical): No     Lack of Transportation (Non-Medical): No   Housing Stability: Low Risk  (05/04/2023)    Housing Stability Vital Sign     Unable to Pay for Housing in the Last Year: No     Number of Times Moved in the Last Year: 0     Homeless in the Last Year: No  Family History   Problem Relation Age of Onset    Anesth Problems Neg Hx     Thyroid Disease Sister     Emphysema Father     COPD Father     Mult Sclerosis Father     Hypertension Mother      Current Outpatient Medications   Medication Sig Dispense Refill    atorvastatin (LIPITOR) 20 MG tablet take 1 tablet by mouth once daily 90 tablet 0    lisinopril-hydroCHLOROthiazide (PRINZIDE;ZESTORETIC) 20-12.5 MG per tablet take 1 tablet by mouth once daily 90 tablet 0    PREMARIN 0.625 MG tablet take 1 tablet by mouth once daily 30 tablet 5    FEROSUL 325 (65 Fe) MG tablet take 1 tablet by mouth daily 30 tablet 2    busPIRone (BUSPAR) 15 MG tablet take 1 tablet by mouth once daily if needed for anxiety 90 tablet 0    escitalopram (LEXAPRO) 10 MG tablet Take 1 tablet by mouth daily 90 tablet 0    vitamin B-12 (CYANOCOBALAMIN) 500 MCG tablet Take 1 tablet by mouth daily      Calcium Carbonate-Vitamin D (OYSTER SHELL CALCIUM/D) 500-5 MG-MCG TABS Take 1 tablet by mouth daily      Cholecalciferol 50 MCG (2000 UT) CAPS Take 50 mcg by mouth daily      omeprazole (PRILOSEC) 40 MG delayed release capsule  Take 1 capsule by mouth as needed      potassium chloride (KLOR-CON) 10 MEQ extended release tablet Take 1 tablet by mouth daily with food       No current facility-administered medications for this visit.     No Known Allergies    Objective:  BP 125/69 (Site: Right Upper Arm, Position: Sitting, Cuff Size: Large Adult)   Pulse 80   Temp 98.3 F (36.8 C) (Oral)   Resp 16   Ht 1.702 m (5\' 7" )   Wt 110.3 kg (243 lb 3.2 oz)   SpO2 96%   BMI 38.09 kg/m     Physical Exam:   Physical Exam  Vitals and nursing note reviewed.   Constitutional:       Appearance: Normal appearance.   HENT:      Head: Normocephalic and atraumatic.      Right Ear: Tympanic membrane, ear canal and external ear normal.      Left Ear: Tympanic membrane, ear canal and external ear normal.      Nose: Nose normal.      Mouth/Throat:      Mouth: Mucous membranes are moist.      Pharynx: Oropharynx is clear.   Eyes:      Extraocular Movements: Extraocular movements intact.      Pupils: Pupils are equal, round, and reactive to light.   Cardiovascular:      Rate and Rhythm: Normal rate and regular rhythm.      Pulses: Normal pulses.      Heart sounds: Normal heart sounds.   Pulmonary:      Effort: Pulmonary effort is normal.      Breath sounds: Normal breath sounds.   Abdominal:      General: Abdomen is flat. Bowel sounds are normal.      Palpations: Abdomen is soft.   Musculoskeletal:         General: Normal range of motion.      Cervical back: Normal range of motion and neck supple.   Skin:     General: Skin is warm and dry.  Capillary Refill: Capillary refill takes less than 2 seconds.   Neurological:      General: No focal deficit present.      Mental Status: She is alert and oriented to person, place, and time. Mental status is at baseline.   Psychiatric:         Mood and Affect: Mood normal.         Behavior: Behavior normal.         Thought Content: Thought content normal.         Judgment: Judgment normal.              No results found  for this visit on 05/06/23.    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 Gastroesophageal reflux disease with esophagitis without hemorrhage  K21.00       3. Acquired hypothyroidism  E03.9 tirzepatide-weight management (ZEPBOUND) 2.5 MG/0.5ML SOAJ subCUTAneous auto-injector pen      4. Obesity (BMI 30-39.9)  E66.9       5. S/P total hysterectomy  Z90.710       6. Severe obesity (BMI 35.0-39.9) with comorbidity  E66.01 tirzepatide-weight management (ZEPBOUND) 2.5 MG/0.5ML SOAJ subCUTAneous auto-injector pen      7. Mixed hyperlipidemia  E78.2 tirzepatide-weight management (ZEPBOUND) 2.5 MG/0.5ML SOAJ subCUTAneous auto-injector pen        No orders of the defined types were placed in this encounter.    Cannot display discharge medications since this is not an admission.      Follow-up and Dispositions    Return in about 3 months (around 08/03/2023).

## 2023-05-06 NOTE — Progress Notes (Signed)
Chief Complaint   Patient presents with    6 Month Follow-Up    Hypertension     Pt here for a 6 month follow up visit         "Have you been to the ER, urgent care clinic since your last visit?  Hospitalized since your last visit?"    NO    "Have you seen or consulted any other health care providers outside our system since your last visit?"    YES, [facility]    Have you had a mammogram?"   NO    No breast cancer screening on file

## 2023-05-11 NOTE — Telephone Encounter (Signed)
Message for an alternative request sent to provider

## 2023-05-18 NOTE — Telephone Encounter (Signed)
 Patient called and stated zepbound was denied, said provider stated there was an alternative weightloss medication they could potentially prescribe. Message was sent on 2/17 but Dr Dorthea Cove has not gotten back to nurse. Please call back with update when available.

## 2023-06-08 MED ORDER — FEROSUL 325 (65 FE) MG PO TABS
325 (65 Fe) MG | ORAL_TABLET | Freq: Every day | ORAL | 2 refills | Status: DC
Start: 2023-06-08 — End: 2023-07-29

## 2023-06-08 MED ORDER — LISINOPRIL-HYDROCHLOROTHIAZIDE 20-12.5 MG PO TABS
20-12.5 MG | ORAL_TABLET | Freq: Every day | ORAL | 0 refills | Status: DC
Start: 2023-06-08 — End: 2023-07-29

## 2023-07-28 MED ORDER — ATORVASTATIN CALCIUM 20 MG PO TABS
20 | ORAL_TABLET | Freq: Every day | ORAL | 0 refills | 90.00000 days | Status: DC
Start: 2023-07-28 — End: 2023-11-15

## 2023-07-29 ENCOUNTER — Encounter

## 2023-07-29 ENCOUNTER — Ambulatory Visit
Admit: 2023-07-29 | Discharge: 2023-07-29 | Payer: BLUE CROSS/BLUE SHIELD | Attending: Family Medicine | Primary: Family Medicine

## 2023-07-29 VITALS — BP 128/72 | HR 94 | Temp 98.20000°F | Resp 16 | Ht 67.0 in | Wt 246.6 lb

## 2023-07-29 DIAGNOSIS — I119 Hypertensive heart disease without heart failure: Secondary | ICD-10-CM

## 2023-07-29 MED ORDER — METAMUCIL 4 IN 1 FIBER 43 % PO POWD
43 | ORAL | 1 refills | 26.00 days | Status: AC
Start: 2023-07-29 — End: ?

## 2023-07-29 MED ORDER — FERROUS SULFATE 325 (65 FE) MG PO TABS
325 | ORAL_TABLET | Freq: Every day | ORAL | 2 refills | 30.00 days | Status: AC
Start: 2023-07-29 — End: ?

## 2023-07-29 MED ORDER — LACTULOSE 10 GM/15ML PO SOLN
10 | Freq: Two times a day (BID) | ORAL | 1 refills | 15.00 days | Status: AC
Start: 2023-07-29 — End: 2023-08-28

## 2023-07-29 MED ORDER — LISINOPRIL-HYDROCHLOROTHIAZIDE 20-12.5 MG PO TABS
20-12.5 | ORAL_TABLET | Freq: Every day | ORAL | 0 refills | 90.00 days | Status: AC
Start: 2023-07-29 — End: ?

## 2023-07-29 MED ORDER — BUSPIRONE HCL 15 MG PO TABS
15 | ORAL_TABLET | ORAL | 0 refills | 30.00000 days | Status: DC
Start: 2023-07-29 — End: 2023-12-22

## 2023-07-29 MED ORDER — ESCITALOPRAM OXALATE 10 MG PO TABS
10 | ORAL_TABLET | Freq: Every day | ORAL | 0 refills | 90.00000 days | Status: DC
Start: 2023-07-29 — End: 2023-10-26

## 2023-07-29 NOTE — Progress Notes (Signed)
 Teresa Beasley is a 43 y.o. female and presents with 3 Month Follow-Up and Hypertension (Pt here for her 3 month follow up for HTN)  .  Hypertension     with a hx of HTN and Hyperlipidemia  43 y.o. Patient here on a routine follow up with no recent symptoms except constipation with pain relieved by Bms with vomiting x 2 to 3 weeks    Subjective:  Cardiovascular Review:  The patient has hypertension   Diet and Lifestyle: generally follows a low fat low cholesterol diet, generally follows a low sodium diet, exercises sporadically  Home BP Monitoring: is not measured at home.  Pertinent ROS: taking medications as instructed, no medication side effects noted, no TIA's, no chest pain on exertion, no dyspnea on exertion, no swelling of ankles.     Review of Systems  Review of Systems - Gastrointestinal ROS: positive for - constipation and heartburn       Past Medical History:   Diagnosis Date    Acquired hypothyroidism 11/02/2018    Anxiety     Bilateral ovarian cysts     Removal 03/24/2010    Ear problems     Essential hypertension 11/02/2018    Gastro-esophageal reflux disease with esophagitis 11/02/2018    Generalized anxiety disorder 11/02/2018    GERD (gastroesophageal reflux disease)     High cholesterol     Hypercholesterolemia     Hypernatremia 11/02/2018    Obesity 11/02/2018    Obesity (BMI 30-39.9) 11/02/2018    Otalgia of right ear 11/02/2018    URI (upper respiratory infection) 11/02/2018     Past Surgical History:   Procedure Laterality Date    CHOLECYSTECTOMY  11/27/2020    GYN      HYSTERECTOMY (CERVIX STATUS UNKNOWN)      IR CHOLECYSTOSTOMY PERCUTANEOUS COMPLETE      OVARIAN CYST REMOVAL  2007    OVARIAN CYST & FALLOPIAN TUBE REMOVED BY Dr. Gaylyn Keas    UPPER GASTROINTESTINAL ENDOSCOPY      WISDOM TOOTH EXTRACTION       Social History     Socioeconomic History    Marital status: Married     Spouse name: None    Number of children: None    Years of education: None    Highest education level: None   Tobacco  Use    Smoking status: Never    Smokeless tobacco: Never   Substance and Sexual Activity    Alcohol use: Not Currently    Drug use: Never    Sexual activity: Yes     Partners: Male     Birth control/protection: Implant     Comment: nuvaring     Social Drivers of Health     Financial Resource Strain: Low Risk  (08/20/2022)    Overall Financial Resource Strain (CARDIA)     Difficulty of Paying Living Expenses: Not hard at all   Food Insecurity: No Food Insecurity (05/04/2023)    Hunger Vital Sign     Worried About Running Out of Food in the Last Year: Never true     Ran Out of Food in the Last Year: Never true   Transportation Needs: No Transportation Needs (05/04/2023)    PRAPARE - Transportation     Lack of Transportation (Medical): No     Lack of Transportation (Non-Medical): No   Housing Stability: Low Risk  (05/04/2023)    Housing Stability Vital Sign     Unable to Pay for Housing in the  Last Year: No     Number of Times Moved in the Last Year: 0     Homeless in the Last Year: No     Family History   Problem Relation Age of Onset    Anesth Problems Neg Hx     Thyroid Disease Sister     Emphysema Father     COPD Father     Mult Sclerosis Father     Hypertension Mother      Current Outpatient Medications   Medication Sig Dispense Refill    atorvastatin  (LIPITOR) 20 MG tablet take 1 tablet by mouth once daily 90 tablet 0    lisinopril -hydroCHLOROthiazide  (PRINZIDE ;ZESTORETIC ) 20-12.5 MG per tablet take 1 tablet by mouth once daily 90 tablet 0    FEROSUL 325 (65 Fe) MG tablet take 1 tablet by mouth daily 30 tablet 2    busPIRone  (BUSPAR ) 15 MG tablet take 1 tablet by mouth once daily if needed for anxiety 90 tablet 0    escitalopram  (LEXAPRO ) 10 MG tablet Take 1 tablet by mouth daily 90 tablet 0    PREMARIN  0.625 MG tablet take 1 tablet by mouth once daily 30 tablet 5    vitamin B-12 (CYANOCOBALAMIN) 500 MCG tablet Take 1 tablet by mouth daily      Calcium  Carbonate-Vitamin D (OYSTER SHELL CALCIUM /D) 500-5 MG-MCG TABS  Take 1 tablet by mouth daily      Cholecalciferol 50 MCG (2000 UT) CAPS Take 50 mcg by mouth daily      omeprazole (PRILOSEC) 40 MG delayed release capsule Take 1 capsule by mouth as needed      potassium chloride (KLOR-CON) 10 MEQ extended release tablet Take 1 tablet by mouth daily with food      tirzepatide -weight management (ZEPBOUND ) 2.5 MG/0.5ML SOAJ subCUTAneous auto-injector pen Inject 2.5 mg into the skin once a week (Patient not taking: Reported on 07/29/2023) 2 mL 0     No current facility-administered medications for this visit.     No Known Allergies    Objective:  BP 128/72 (BP Site: Right Upper Arm, Patient Position: Sitting, BP Cuff Size: Large Adult)   Pulse 94   Temp 98.2 F (36.8 C) (Oral)   Resp 16   Ht 1.702 m (5\' 7" )   Wt 111.9 kg (246 lb 9.6 oz)   SpO2 97%   BMI 38.62 kg/m     Physical Exam:   Physical Exam  Vitals and nursing note reviewed.   Constitutional:       Appearance: Normal appearance. She is obese.   HENT:      Head: Normocephalic and atraumatic.      Right Ear: Tympanic membrane, ear canal and external ear normal.      Left Ear: Tympanic membrane, ear canal and external ear normal.      Nose: Nose normal.      Mouth/Throat:      Mouth: Mucous membranes are moist.      Pharynx: Oropharynx is clear.   Eyes:      Extraocular Movements: Extraocular movements intact.      Pupils: Pupils are equal, round, and reactive to light.   Cardiovascular:      Rate and Rhythm: Normal rate and regular rhythm.      Pulses: Normal pulses.      Heart sounds: Normal heart sounds.   Pulmonary:      Effort: Pulmonary effort is normal.      Breath sounds: Normal breath sounds.   Abdominal:  General: Abdomen is flat. Bowel sounds are normal.      Palpations: Abdomen is soft.   Musculoskeletal:         General: Normal range of motion.      Cervical back: Normal range of motion and neck supple.   Skin:     General: Skin is warm and dry.      Capillary Refill: Capillary refill takes less than 2  seconds.   Neurological:      General: No focal deficit present.      Mental Status: She is alert and oriented to person, place, and time. Mental status is at baseline.   Psychiatric:         Mood and Affect: Mood normal.         Behavior: Behavior normal.         Thought Content: Thought content normal.         Judgment: Judgment normal.              No results found for this visit on 07/29/23.    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 Gastroesophageal reflux disease with esophagitis without hemorrhage  K21.00       3. Acquired hypothyroidism  E03.9       4. Hypokalemia  E87.6       5. Iron deficiency anemia secondary to inadequate dietary iron intake  D50.8 CBC      6. Mixed hyperlipidemia  E78.2       7. S/P cholecystectomy  Z90.49       8. Severe obesity (BMI 35.0-39.9) with comorbidity (HCC)  E66.01       9. Obesity (BMI 30-39.9)  E66.9       ZOX:WRUE controlled and at goal    No orders of the defined types were placed in this encounter.    Cannot display discharge medications since this is not an admission.

## 2023-07-29 NOTE — Progress Notes (Signed)
 Chief Complaint   Patient presents with    3 Month Follow-Up    Hypertension     Pt here for her 3 month follow up for HTN     BP 128/72 (BP Site: Right Upper Arm, Patient Position: Sitting, BP Cuff Size: Large Adult)   Pulse 94   Temp 98.2 F (36.8 C) (Oral)   Resp 16   Ht 1.702 m (5\' 7" )   Wt 111.9 kg (246 lb 9.6 oz)   SpO2 97%   BMI 38.62 kg/m     Have you been to the ER, urgent care clinic since your last visit?  Hospitalized since your last visit?   NO    Have you seen or consulted any other health care providers outside our system since your last visit?   NO

## 2023-08-10 MED ORDER — NALTREXONE-BUPROPION HCL ER 8-90 MG PO TB12
8-90 | ORAL_TABLET | Freq: Two times a day (BID) | ORAL | 2 refills | 90.00 days | Status: AC
Start: 2023-08-10 — End: 2023-09-09

## 2023-08-10 NOTE — Progress Notes (Signed)
 rx

## 2023-08-21 ENCOUNTER — Encounter: Payer: BLUE CROSS/BLUE SHIELD | Attending: Family Medicine | Primary: Family Medicine

## 2023-09-07 MED ORDER — PREMARIN 0.625 MG PO TABS
0.625 | ORAL_TABLET | Freq: Every day | ORAL | 5 refills | 30.00000 days | Status: AC
Start: 2023-09-07 — End: ?

## 2023-09-07 NOTE — Telephone Encounter (Signed)
 Prescription for premarin  refilled.  Discussed with patient that she should discuss getting future refills from her PCP or regular Gyn.  Patient understands and is amenable.  She has actually already reached out to make a follow up appointment with [facility].   [facility]

## 2023-09-07 NOTE — Telephone Encounter (Signed)
 Teresa Beasley was returning your call.  She is a little nervous since she is almost out of this medication

## 2023-10-09 MED ORDER — LISINOPRIL-HYDROCHLOROTHIAZIDE 20-12.5 MG PO TABS
20-12.5 | ORAL_TABLET | Freq: Every day | ORAL | 1 refills | Status: DC
Start: 2023-10-09 — End: 2023-12-11

## 2023-10-26 MED ORDER — ESCITALOPRAM OXALATE 10 MG PO TABS
10 | ORAL_TABLET | Freq: Every day | ORAL | 0 refills | 90.00000 days | Status: DC
Start: 2023-10-26 — End: 2024-01-29

## 2023-11-08 NOTE — Other (Signed)
 Will discuss result(s) at next visit.

## 2023-11-12 NOTE — Telephone Encounter (Signed)
 error

## 2023-11-16 MED ORDER — ATORVASTATIN CALCIUM 20 MG PO TABS
20 | ORAL_TABLET | Freq: Every day | ORAL | 0 refills | 90.00000 days | Status: DC
Start: 2023-11-16 — End: 2024-01-29

## 2023-11-20 ENCOUNTER — Encounter: Payer: BLUE CROSS/BLUE SHIELD | Attending: Family Medicine | Primary: Family Medicine

## 2023-12-11 MED ORDER — LISINOPRIL-HYDROCHLOROTHIAZIDE 20-12.5 MG PO TABS
20-12.5 | ORAL_TABLET | Freq: Every day | ORAL | 0 refills | Status: DC
Start: 2023-12-11 — End: 2024-01-12

## 2023-12-22 MED ORDER — BUSPIRONE HCL 15 MG PO TABS
15 | ORAL_TABLET | ORAL | 0 refills | 30.00000 days | Status: AC
Start: 2023-12-22 — End: ?

## 2024-01-01 ENCOUNTER — Encounter: Payer: BLUE CROSS/BLUE SHIELD | Attending: Family Medicine | Primary: Family Medicine

## 2024-01-12 MED ORDER — LISINOPRIL-HYDROCHLOROTHIAZIDE 20-12.5 MG PO TABS
20-12.5 | ORAL_TABLET | Freq: Every day | ORAL | 1 refills | Status: AC
Start: 2024-01-12 — End: ?

## 2024-01-29 ENCOUNTER — Telehealth

## 2024-01-29 MED ORDER — ATORVASTATIN CALCIUM 20 MG PO TABS
20 | ORAL_TABLET | Freq: Every day | ORAL | 0 refills | 90.00000 days | Status: AC
Start: 2024-01-29 — End: ?

## 2024-01-29 MED ORDER — ESCITALOPRAM OXALATE 10 MG PO TABS
10 | ORAL_TABLET | Freq: Every day | ORAL | 0 refills | 90.00000 days | Status: AC
Start: 2024-01-29 — End: ?

## 2024-01-29 NOTE — Telephone Encounter (Signed)
 done

## 2024-01-29 NOTE — Telephone Encounter (Signed)
"  Patient has appointment 03/16/2024 can she get enough medication to last until her visit as she has not been able to get off work to come in for a visit.      escitalopram  (LEXAPRO ) 10 MG tablet      atorvastatin  (LIPITOR) 20 MG tablet    "

## 2024-03-16 ENCOUNTER — Encounter: Payer: BLUE CROSS/BLUE SHIELD | Attending: Family Medicine | Primary: Family Medicine
